# Patient Record
Sex: Female | Born: 1985 | Race: White | Hispanic: No | Marital: Married | Smoking: Never smoker
Health system: Southern US, Community
[De-identification: ages and names within clinical notes are randomized; demographics above are authoritative.]

## PROBLEM LIST (undated history)

## (undated) DIAGNOSIS — D489 Neoplasm of uncertain behavior, unspecified: Secondary | ICD-10-CM

## (undated) DIAGNOSIS — R519 Headache, unspecified: Secondary | ICD-10-CM

## (undated) DIAGNOSIS — R51 Headache: Secondary | ICD-10-CM

## (undated) DIAGNOSIS — K219 Gastro-esophageal reflux disease without esophagitis: Secondary | ICD-10-CM

## (undated) HISTORY — PX: OOPHORECTOMY: SHX86

## (undated) HISTORY — DX: Gastro-esophageal reflux disease without esophagitis: K21.9

## (undated) HISTORY — PX: NO PAST SURGERIES: SHX2092

---

## 2012-06-22 ENCOUNTER — Ambulatory Visit (INDEPENDENT_AMBULATORY_CARE_PROVIDER_SITE_OTHER): Payer: Self-pay | Admitting: Physician Assistant

## 2012-06-22 VITALS — BP 119/73 | HR 61 | Temp 98.6°F | Resp 16 | Ht 69.25 in | Wt 228.2 lb

## 2012-06-22 DIAGNOSIS — Z0289 Encounter for other administrative examinations: Secondary | ICD-10-CM

## 2012-06-22 DIAGNOSIS — Z111 Encounter for screening for respiratory tuberculosis: Secondary | ICD-10-CM

## 2012-06-22 NOTE — Progress Notes (Signed)
  Subjective:    Patient ID: Lindsay Turner, female    DOB: [birth year], 26 y.o.   MRN: [id number]  HPI 26 year old female presents for pre-employment PE and TB test. 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 She has no known medical problems and does not take any medications regularly.  She has had TB tests in the past and never had a positive.  Had a Tdap in 2006.     Review of Systems  All other systems reviewed and are negative.       Objective:   Physical Exam  Constitutional: She is oriented to person, place, and time. She appears well-developed and well-nourished.  HENT:  Head: Normocephalic and atraumatic.  Right Ear: Hearing, tympanic membrane, external ear and ear canal normal.  Left Ear: Hearing, tympanic membrane, external ear and ear canal normal.  Mouth/Throat: Uvula is midline, oropharynx is clear and moist and mucous membranes are normal. No oropharyngeal exudate.  Eyes: Conjunctivae are normal.  Neck: Normal range of motion.  Cardiovascular: Normal rate, regular rhythm and normal heart sounds.   Pulmonary/Chest: Effort normal and breath sounds normal.  Abdominal: Soft. Bowel sounds are normal. There is no tenderness.  Lymphadenopathy:    She has no cervical adenopathy.  Neurological: She is alert and oriented to person, place, and time.  Psychiatric: She has a normal mood and affect. Her behavior is normal. Judgment and thought content normal.          Assessment & Plan:   1. Other general medical examination for administrative purposes    2. Screening for tuberculosis  TB Skin Test   Forms completed. Return in 48-72 hours for TB read

## 2012-06-22 NOTE — Progress Notes (Signed)
  Subjective:    Patient ID: Lindsay Turner, female    DOB: May 28, 1986, 26 y.o.   MRN: [id number]  HPI    Review of Systems     Objective:   Physical Exam        Assessment & Plan:

## 2012-06-22 NOTE — Progress Notes (Signed)
PPD Placement note  Lindsay Turner, 26 y.o. female is here today for placement of PPD test  Reason for PPD test: pre employment  1. Has the patient ever had a TB Skin Test?: yes  2. Has the patient had a TB Skin Test in the past 6 weeks? no    3. Has the patient ever had a positive reaction? no  If yes, were you treated with INH? n/a   4. Has the patient ever had a BCG vaccine? no  5. Has the patient ever had Tuberculosis? no  6. Has the patient been in recent contact with anyone known or suspected of having     active TB disease?: no      Date of exposure (if applicable):       Name of person they were exposed to (if applicable):   Patient's Country of origin?:   7. Has the patient ever been advised by a healthcare provider not to have a TB skin test? no   Verified in allergy area and with patient that the patient is not allergic to the products PPD is made of (Phenol or Tween). Yes  Is patient taking any oral or IV steroid medication now or have they taken it in the last month? no  PPD placed on 06/22/2012 at 11:05 am.  Patient advised to return for reading within 48-72 hours.

## 2012-06-25 ENCOUNTER — Ambulatory Visit (INDEPENDENT_AMBULATORY_CARE_PROVIDER_SITE_OTHER): Payer: Self-pay | Admitting: *Deleted

## 2012-06-25 DIAGNOSIS — Z111 Encounter for screening for respiratory tuberculosis: Secondary | ICD-10-CM

## 2012-06-25 LAB — TB SKIN TEST
Induration: 0 mm
TB Skin Test: NEGATIVE

## 2013-09-21 ENCOUNTER — Ambulatory Visit (INDEPENDENT_AMBULATORY_CARE_PROVIDER_SITE_OTHER): Payer: No Typology Code available for payment source | Admitting: Family Medicine

## 2013-09-21 VITALS — BP 146/72 | HR 92 | Temp 98.4°F | Resp 18 | Ht 69.0 in | Wt 222.0 lb

## 2013-09-21 DIAGNOSIS — M79609 Pain in unspecified limb: Secondary | ICD-10-CM

## 2013-09-21 DIAGNOSIS — L600 Ingrowing nail: Secondary | ICD-10-CM

## 2013-09-21 DIAGNOSIS — M79674 Pain in right toe(s): Secondary | ICD-10-CM

## 2013-09-21 NOTE — Progress Notes (Signed)
Subjective:    Patient ID: Lindsay Turner, female    DOB: 04-13-1986, 27 y.o.   MRN: [id number] This chart was scribed for Meredith Staggers, MD by Valera Castle, ED Scribe. This patient was seen in room 9 and the patient's care was started at 7:15 PM.  Chief Complaint  Patient presents with  . Ingrown Toenail    1 week   HPI Lindsay Turner is a 27 y.o. female Pt reports today with an ingrown toenail on her right great toe, with associated pain and swelling, onset 1.5 weeks ago. She also reports the area has been draining for about 1 week. She states that yesterday a child stepped on her toe, exacerbating her pain. She reports pulling off the top part of her toenail after messing with it. She reports applying neosporin to the area. She reports soaking her toe in warm water. She denies any allergies to medications. She denies fever, and any other associated symptoms. She denies any medical history.  PCP - No primary provider on file.  There are no active problems to display for this patient.  No past medical history on file. No past surgical history on file. No Known Allergies Prior to Admission medications   Not on File    Review of Systems  Constitutional: Negative for fever.  Skin: Positive for wound (ingrown toenail, right geat toe, with drainage).       Objective:   Physical Exam  Nursing note and vitals reviewed. Constitutional: She is oriented to person, place, and time. She appears well-developed and well-nourished. No distress.  HENT:  Head: Normocephalic and atraumatic.  Eyes: EOM are normal.  Neck: Neck supple. No tracheal deviation present.  Cardiovascular: Normal rate.   Pulmonary/Chest: Effort normal. No respiratory distress.  Musculoskeletal: Normal range of motion.  Neurological: She is alert and oriented to person, place, and time.  Skin: Skin is warm and dry. She is not diaphoretic.  Right great toe medial nailfold edematous, slightly erythematous,  with small amount of clear to yellow discharge along lateral nailfold. Some granulation tissue seen at distal nail. Unable to visiualize distal nail laterally. Slight ttp over distal lateral nailfold.   Psychiatric: She has a normal mood and affect. Her behavior is normal.    BP 146/72  Pulse 92  Temp(Src) 98.4 F (36.9 C) (Oral)  Resp 18  Ht 5\' 9"  (1.753 m)  Wt 222 lb (100.699 kg)  BMI 32.77 kg/m2  SpO2 100%  LMP 09/12/2013     Assessment & Plan:    Lindsay Turner is a 27 y.o. female Pain of great toe, right  Ingrown right greater toenail  Wedge excision as above. Wound care and future nail care discussed. rtc precautions.   No orders of the defined types were placed in this encounter.   Patient Instructions  INGROWN TOENAIL . Keep area clean, dry and bandaged for 24 hours. . After 24 hours, remove outer bandage and leave yellow gauze in place. Nuala Alpha toe/foot in warm soapy water for 5-10 minutes, once daily for 5 days. Rebandage toe after each cleaning. . Continue soaks until yellow gauze falls off. . Notify the office if you experience any of the following signs of infection: Swelling, redness, pus drainage, streaking, fever > 101.0 F  Return to the clinic or go to the nearest emergency room if any of your symptoms worsen or new symptoms occur.     I personally performed the services described in this documentation, which was  scribed in my presence. The recorded information has been reviewed and considered, and addended by me as needed.

## 2013-09-21 NOTE — Patient Instructions (Signed)
INGROWN TOENAIL . Keep area clean, dry and bandaged for 24 hours. . After 24 hours, remove outer bandage and leave yellow gauze in place. Nuala Alpha toe/foot in warm soapy water for 5-10 minutes, once daily for 5 days. Rebandage toe after each cleaning. . Continue soaks until yellow gauze falls off. . Notify the office if you experience any of the following signs of infection: Swelling, redness, pus drainage, streaking, fever > 101.0 F  Return to the clinic or go to the nearest emergency room if any of your symptoms worsen or new symptoms occur.

## 2013-09-21 NOTE — Progress Notes (Signed)
Procedure:  Consent obtained.  2% lido mixed 50:50 with marcaine used for a digital block on R great toe.  Betadine prep.  Lateral wedge excision.  Granulation tissue excised and xeroform gauze placed on nail bed.  Drsg placed.  Wound care d/w pt.

## 2013-10-10 ENCOUNTER — Ambulatory Visit (INDEPENDENT_AMBULATORY_CARE_PROVIDER_SITE_OTHER): Payer: No Typology Code available for payment source | Admitting: Family Medicine

## 2013-10-10 VITALS — BP 102/70 | HR 108 | Temp 99.4°F | Resp 16 | Ht 69.5 in | Wt 220.0 lb

## 2013-10-10 DIAGNOSIS — J111 Influenza due to unidentified influenza virus with other respiratory manifestations: Secondary | ICD-10-CM

## 2013-10-10 DIAGNOSIS — R509 Fever, unspecified: Secondary | ICD-10-CM

## 2013-10-10 LAB — POCT INFLUENZA A/B
Influenza A, POC: POSITIVE
Influenza B, POC: NEGATIVE

## 2013-10-10 MED ORDER — OSELTAMIVIR PHOSPHATE 75 MG PO CAPS: 75.0000 mg | ORAL_CAPSULE | Freq: Two times a day (BID) | ORAL | Status: DC

## 2013-10-10 MED ORDER — HYDROCODONE-HOMATROPINE 5-1.5 MG/5ML PO SYRP: 5.0000 mL | ORAL_SOLUTION | Freq: Three times a day (TID) | ORAL | Status: DC | PRN

## 2013-10-10 NOTE — Patient Instructions (Signed)

## 2013-10-10 NOTE — Progress Notes (Signed)
° °  Subjective:    Patient ID: Lindsay Turner, female    DOB: May 27, 1986, 27 y.o.   MRN: [id number] This chart was scribed for Elvina Sidle, MD by Valera Castle, ED Scribe. This patient was seen in room 9 and the patient's care was started at 6:30 PM.   Chief Complaint  Patient presents with   Abdominal Pain   Headache   Sore Throat   Fever   Cough   Otitis Media    HPI Lindsay Turner is a 27 y.o. female who presents to the [facility] complaining of sudden illness, onset 2 days ago, including fatigue, cough, fever, nasal congestion, sinus pressure, bilateral ear pain, diarrhea, stomach cramps, and mild back pain. She denies anything relieving her symptoms. She denies vomiting, urinary symptoms, and any other associated symptoms.   She states she works at [employer] with lots of children.   Review of Systems  Constitutional: Positive for fever and fatigue.  HENT: Positive for congestion, ear pain and sinus pressure.   Respiratory: Positive for cough.   Gastrointestinal: Positive for abdominal pain and diarrhea. Negative for vomiting.  Genitourinary: Negative.   Musculoskeletal: Positive for back pain.       Objective:   Physical Exam  Results for orders placed in visit on 10/10/13  POCT INFLUENZA A/B      Result Value Range   Influenza A, POC Positive     Influenza B, POC Negative     BP 102/70   Pulse 108   Temp(Src) 99.4 F (37.4 C) (Oral)   Resp 16   Ht 5' 9.5" (1.765 m)   Wt 220 lb (99.791 kg)   BMI 32.03 kg/m2   SpO2 98%   LMP 09/12/2013  Nursing note and vitals reviewed. Constitutional: Pt is oriented to person, place, and time. Pt appears well-developed and well-nourished. No distress.  HENT:  Head: Normocephalic and atraumatic.  Eyes: EOM are normal. Pupils are equal, round, and reactive to light.  Neck: Neck supple. No tracheal deviation present.  Cardiovascular: Normal rate, regular rhythm and normal heart sounds.  Exam reveals no gallop and no friction  rub. No murmur heard. Pulmonary/Chest: Effort normal and breath sounds normal. No respiratory distress. Pt has no wheezes. Pt has no rales.  Abdominal: Soft. Bowel sounds are normal. There is no tenderness. There is no rebound and no guarding.  Musculoskeletal: Normal range of motion.  Neurological: Pt is alert and oriented to person, place, and time.  Skin: Skin is warm and dry.  Psychiatric: Pt has a normal mood and affect. Pt's behavior is normal.  Nasal passages show mucopurulent discharge TMs: Mild bulge, no erythema Oropharynx: Mild erythema Chest: Clear Results for orders placed in visit on 10/10/13  POCT INFLUENZA A/B      Result Value Range   Influenza A, POC Positive     Influenza B, POC Negative         Assessment & Plan:    Fever, unspecified - Plan: POCT Influenza A/B, oseltamivir (TAMIFLU) 75 MG capsule, HYDROcodone-homatropine (HYCODAN) 5-1.5 MG/5ML syrup  Influenza - Plan: HYDROcodone-homatropine (HYCODAN) 5-1.5 MG/5ML syrup  Signed, Elvina Sidle, MD     I personally performed the services described in this documentation, which was scribed in my presence. The recorded information has been reviewed and is accurate.

## 2014-03-23 ENCOUNTER — Ambulatory Visit (INDEPENDENT_AMBULATORY_CARE_PROVIDER_SITE_OTHER): Payer: No Typology Code available for payment source | Admitting: Family Medicine

## 2014-03-23 VITALS — BP 126/74 | HR 110 | Temp 100.0°F | Resp 18 | Ht 69.0 in | Wt 221.4 lb

## 2014-03-23 DIAGNOSIS — J029 Acute pharyngitis, unspecified: Secondary | ICD-10-CM

## 2014-03-23 DIAGNOSIS — R509 Fever, unspecified: Secondary | ICD-10-CM

## 2014-03-23 DIAGNOSIS — R6883 Chills (without fever): Secondary | ICD-10-CM

## 2014-03-23 LAB — POCT CBC
GRANULOCYTE PERCENT: 83.3 % — AB (ref 37–80)
HCT, POC: 42.8 % (ref 37.7–47.9)
HEMOGLOBIN: 13.7 g/dL (ref 12.2–16.2)
Lymph, poc: 0.7 (ref 0.6–3.4)
MCH, POC: 28.5 pg (ref 27–31.2)
MCHC: 32 g/dL (ref 31.8–35.4)
MCV: 89.1 fL (ref 80–97)
MID (CBC): 0.5 (ref 0–0.9)
MPV: 10.6 fL (ref 0–99.8)
PLATELET COUNT, POC: 212 10*3/uL (ref 142–424)
POC Granulocyte: 6.2 (ref 2–6.9)
POC LYMPH PERCENT: 10.1 %L (ref 10–50)
POC MID %: 6.6 % (ref 0–12)
RBC: 4.8 M/uL (ref 4.04–5.48)
RDW, POC: 13.8 %
WBC: 7.4 10*3/uL (ref 4.6–10.2)

## 2014-03-23 LAB — POCT RAPID STREP A (OFFICE): RAPID STREP A SCREEN: NEGATIVE

## 2014-03-23 MED ORDER — AMOXICILLIN 500 MG PO CAPS: 500.0000 mg | ORAL_CAPSULE | Freq: Three times a day (TID) | ORAL | Status: DC

## 2014-03-23 NOTE — Progress Notes (Addendum)
Subjective:    Patient ID: Lindsay Turner, female    DOB: 1986/03/16, 28 y.o.   MRN: [id number]  No PCP Per Patient  HPI This chart was scribed for Lindsay Agreste, MD by Ladene Artist, ED Scribe. The patient was seen in room 4. Patient's care was started at 2:37 PM.  HPI Comments: Lindsay Turner is a 28 y.o. female who presents to the [facility] complaining of sore throat onset yesterday evening. Pt reports associated subjective fever, chills and body aches onset last night. She denies cough, SOB, rash, HA, ear pain, urinary symptoms. No sick contacts. No recent hiking, camping, exposure to ticks, yard work. Pt does have pets.  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  There are no active problems to display for this patient.  Past Medical History  Diagnosis Date  . GERD (gastroesophageal reflux disease)    No past surgical history on file. No Known Allergies Prior to Admission medications   Not on File   History   Social History  . Marital Status: Single    Spouse Name: N/A    Number of Children: N/A  . Years of Education: N/A   Occupational History  . Not on file.   Social History Main Topics  . Smoking status: Never Smoker   . Smokeless tobacco: Not on file  . Alcohol Use: Yes  . Drug Use: No  . Sexual Activity: Not on file   Other Topics Concern  . Not on file   Social History Narrative  . No narrative on file   Review of Systems  Constitutional: Positive for fever and chills.  HENT: Positive for sore throat. Negative for ear pain.   Respiratory: Negative for cough and shortness of breath.   Genitourinary: Negative for dysuria, hematuria, decreased urine volume and difficulty urinating.  Musculoskeletal: Positive for myalgias.  Skin: Negative for rash.  Neurological: Negative for headaches.      Objective:   Physical Exam  Vitals reviewed. Constitutional: She is oriented to person, place, and time. She  appears well-developed and well-nourished. No distress.  HENT:  Head: Normocephalic and atraumatic.  Right Ear: Hearing, tympanic membrane, external ear and ear canal normal.  Left Ear: Hearing, tympanic membrane, external ear and ear canal normal.  Nose: Nose normal.  Mouth/Throat: Posterior oropharyngeal erythema present. No oropharyngeal exudate.  Mouth/Throat:  1 erythematous spot on top L pharynx Minimal erythema of tonsillar recess No exudate   Eyes: Conjunctivae and EOM are normal. Pupils are equal, round, and reactive to light.  Cardiovascular: Normal rate, regular rhythm, normal heart sounds and intact distal pulses.   No murmur heard. Pulmonary/Chest: Effort normal and breath sounds normal. No respiratory distress. She has no wheezes. She has no rhonchi.  Neurological: She is alert and oriented to person, place, and time.  Skin: Skin is warm and dry. No rash noted.  Psychiatric: She has a normal mood and affect. Her behavior is normal.   Filed Vitals:   03/23/14 1426  BP: 126/74  Pulse: 110  Temp: 100 F (37.8 C)  TempSrc: Oral  Resp: 18  Height: 5\' 9"  (1.753 m)  Weight: 221 lb 6.4 oz (100.426 kg)  SpO2: 100%   Results for orders placed in visit on 03/23/14  POCT RAPID STREP A (OFFICE)      Result Value Ref Range   Rapid Strep A Screen Negative  Negative  POCT CBC      Result Value Ref  Range   WBC 7.4  4.6 - 10.2 K/uL   Lymph, poc 0.7  0.6 - 3.4   POC LYMPH PERCENT 10.1  10 - 50 %L   MID (cbc) 0.5  0 - 0.9   POC MID % 6.6  0 - 12 %M   POC Granulocyte 6.2  2 - 6.9   Granulocyte percent 83.3 (*) 37 - 80 %G   RBC 4.80  4.04 - 5.48 M/uL   Hemoglobin 13.7  12.2 - 16.2 g/dL   HCT, POC 42.8  37.7 - 47.9 %   MCV 89.1  80 - 97 fL   MCH, POC 28.5  27 - 31.2 pg   MCHC 32.0  31.8 - 35.4 g/dL   RDW, POC 13.8     Platelet Count, POC 212  142 - 424 K/uL   MPV 10.6  0 - 99.8 fL       Assessment & Plan:   [name] is a 28 y.o. female Sore throat - Plan:  POCT rapid strep A, Culture, Group A Strep, amoxicillin (AMOXIL) 500 MG capsule  Fever, unspecified - Plan: POCT rapid strep A, Culture, Group A Strep, POCT CBC, amoxicillin (AMOXIL) 500 MG capsule  Chills - Plan: POCT CBC  Viral pharyngitis vs false negative strep.  Borderline leukocytes, options discussed re: cx and waiting, or start amox, then d/c if cx negative. Will start amoxicillin as below, sx care, rtc precautions.   Meds ordered this encounter  Medications  . amoxicillin (AMOXIL) 500 MG capsule    Sig: Take 1 capsule (500 mg total) by mouth 3 (three) times daily.    Dispense:  30 capsule    Refill:  0   Patient Instructions  Tylenol or motrin if needed for fever, drink plenty of fluids. We can start antibiotic as borderline infection fighting cells, but if throat culture negative, can stop them at that point.   Return to the clinic or go to the nearest emergency room if any of your symptoms worsen or new symptoms occur. Sore Throat A sore throat is pain, burning, irritation, or scratchiness of the throat. There is often pain or tenderness when swallowing or talking. A sore throat may be accompanied by other symptoms, such as coughing, sneezing, fever, and swollen neck glands. A sore throat is often the first sign of another sickness, such as a cold, flu, strep throat, or mononucleosis (commonly known as mono). Most sore throats go away without medical treatment. CAUSES  The most common causes of a sore throat include:  A viral infection, such as a cold, flu, or mono.  A bacterial infection, such as strep throat, tonsillitis, or whooping cough.  Seasonal allergies.  Dryness in the air.  Irritants, such as smoke or pollution.  Gastroesophageal reflux disease (GERD). HOME CARE INSTRUCTIONS   Only take over-the-counter medicines as directed by your caregiver.  Drink enough fluids to keep your urine clear or pale yellow.  Rest as needed.  Try using throat sprays,  lozenges, or sucking on hard candy to ease any pain (if older than 4 years or as directed).  Sip warm liquids, such as broth, herbal tea, or warm water with honey to relieve pain temporarily. You may also eat or drink cold or frozen liquids such as frozen ice pops.  Gargle with salt water (mix 1 tsp salt with 8 oz of water).  Do not smoke and avoid secondhand smoke.  Put a cool-mist humidifier in your bedroom at night to moisten the  air. You can also turn on a hot shower and sit in the bathroom with the door closed for 5 10 minutes. SEEK IMMEDIATE MEDICAL CARE IF:  You have difficulty breathing.  You are unable to swallow fluids, soft foods, or your saliva.  You have increased swelling in the throat.  Your sore throat does not get better in 7 days.  You have nausea and vomiting.  You have a fever or persistent symptoms for more than 2 3 days.  You have a fever and your symptoms suddenly get worse. MAKE SURE YOU:   Understand these instructions.  Will watch your condition.  Will get help right away if you are not doing well or get worse. Document Released: 11/12/2004 Document Revised: 09/21/2012 Document Reviewed: 06/12/2012 [facility] Patient Information 2014 [place], [place].   I personally performed the services described in this documentation, which was scribed in my presence. The recorded information has been reviewed and is accurate.

## 2014-03-23 NOTE — Patient Instructions (Addendum)
Tylenol or motrin if needed for fever, drink plenty of fluids. We can start antibiotic as borderline infection fighting cells, but if throat culture negative, can stop them at that point.   Return to the clinic or go to the nearest emergency room if any of your symptoms worsen or new symptoms occur. Sore Throat A sore throat is pain, burning, irritation, or scratchiness of the throat. There is often pain or tenderness when swallowing or talking. A sore throat may be accompanied by other symptoms, such as coughing, sneezing, fever, and swollen neck glands. A sore throat is often the first sign of another sickness, such as a cold, flu, strep throat, or mononucleosis (commonly known as mono). Most sore throats go away without medical treatment. CAUSES  The most common causes of a sore throat include:  A viral infection, such as a cold, flu, or mono.  A bacterial infection, such as strep throat, tonsillitis, or whooping cough.  Seasonal allergies.  Dryness in the air.  Irritants, such as smoke or pollution.  Gastroesophageal reflux disease (GERD). HOME CARE INSTRUCTIONS   Only take over-the-counter medicines as directed by your caregiver.  Drink enough fluids to keep your urine clear or pale yellow.  Rest as needed.  Try using throat sprays, lozenges, or sucking on hard candy to ease any pain (if older than 4 years or as directed).  Sip warm liquids, such as broth, herbal tea, or warm water with honey to relieve pain temporarily. You may also eat or drink cold or frozen liquids such as frozen ice pops.  Gargle with salt water (mix 1 tsp salt with 8 oz of water).  Do not smoke and avoid secondhand smoke.  Put a cool-mist humidifier in your bedroom at night to moisten the air. You can also turn on a hot shower and sit in the bathroom with the door closed for 5 10 minutes. SEEK IMMEDIATE MEDICAL CARE IF:  You have difficulty breathing.  You are unable to swallow fluids, soft foods,  or your saliva.  You have increased swelling in the throat.  Your sore throat does not get better in 7 days.  You have nausea and vomiting.  You have a fever or persistent symptoms for more than 2 3 days.  You have a fever and your symptoms suddenly get worse. MAKE SURE YOU:   Understand these instructions.  Will watch your condition.  Will get help right away if you are not doing well or get worse. Document Released: 11/12/2004 Document Revised: 09/21/2012 Document Reviewed: 06/12/2012 [facility] Patient Information 2014 [place], [place].

## 2014-03-25 LAB — CULTURE, GROUP A STREP: Organism ID, Bacteria: NORMAL

## 2014-08-17 ENCOUNTER — Ambulatory Visit (INDEPENDENT_AMBULATORY_CARE_PROVIDER_SITE_OTHER): Payer: No Typology Code available for payment source | Admitting: Family Medicine

## 2014-08-17 VITALS — BP 128/78 | HR 71 | Temp 98.0°F | Resp 16 | Ht 70.0 in | Wt 222.0 lb

## 2014-08-17 DIAGNOSIS — L600 Ingrowing nail: Secondary | ICD-10-CM

## 2014-08-17 MED ORDER — DOXYCYCLINE HYCLATE 100 MG PO TABS: 100.0000 mg | ORAL_TABLET | Freq: Two times a day (BID) | ORAL | Status: DC

## 2014-08-17 NOTE — Progress Notes (Signed)
° °  Subjective:    Patient ID: Lindsay Turner, female    DOB: 1986/07/06, 28 y.o.   MRN: [id number] This chart was scribed for Lindsay Haber, MD by Zola Button, Medical Scribe. This patient was seen in Room 4 and the patient's care was started at 9:27 AM.   HPI HPI Comments: Lindsay Turner is a 28 y.o. female who presents to the [facility] complaining of an ingrown toenail in her right 1st toe that has been there for a while. She had an ingrown toenail in the same toe about months ago; she had it removed last time. Patient is training for a half-marathon and denies any pain when she is running. She has done a half-marathon before. She has delayed coming in because it had not been bothering her very much.  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  Review of Systems  Constitutional: Negative for appetite change and fatigue.  HENT: Negative for congestion, ear discharge and sinus pressure.   Eyes: Negative for discharge.  Respiratory: Negative for cough.   Cardiovascular: Negative for chest pain.  Gastrointestinal: Negative for abdominal pain and diarrhea.  Genitourinary: Negative for frequency and hematuria.  Musculoskeletal: Negative for back pain.  Skin: Positive for wound (ingrown toenail).  Neurological: Negative for seizures and headaches.  Psychiatric/Behavioral: Negative for hallucinations.       Objective:   Physical Exam CONSTITUTIONAL: Well developed/well nourished HEAD: Normocephalic/atraumatic EYES: EOM/PERRL ENMT: Mucous membranes moist NECK: supple no meningeal signs SPINE: entire spine nontender CV: S1/S2 noted, no murmurs/rubs/gallops noted LUNGS: Lungs are clear to auscultation bilaterally, no apparent distress ABDOMEN: soft, nontender, no rebound or guarding GU: no cva tenderness NEURO: Pt is awake/alert, moves all extremitiesx4 EXTREMITIES: pulses normal, full ROM. Mildly tender, swollen and erythenatous fibular aspect of right big  toenail. SKIN: warm, color normal PSYCH: no abnormalities of mood noted        Assessment & Plan:   Ingrown right big toenail - Plan: doxycycline (VIBRA-TABS) 100 MG tablet  Signed, Lindsay Haber, MD

## 2016-07-10 ENCOUNTER — Ambulatory Visit (INDEPENDENT_AMBULATORY_CARE_PROVIDER_SITE_OTHER): Payer: 59 | Admitting: Physician Assistant

## 2016-07-10 VITALS — BP 118/82 | HR 82 | Temp 98.4°F | Resp 18 | Ht 70.0 in | Wt 254.4 lb

## 2016-07-10 DIAGNOSIS — R350 Frequency of micturition: Secondary | ICD-10-CM

## 2016-07-10 LAB — POCT URINALYSIS DIP (MANUAL ENTRY)
BILIRUBIN UA: NEGATIVE
GLUCOSE UA: NEGATIVE
Ketones, POC UA: NEGATIVE
NITRITE UA: NEGATIVE
PH UA: 5
Protein Ur, POC: NEGATIVE
RBC UA: NEGATIVE
Spec Grav, UA: 1.025
UROBILINOGEN UA: 0.2

## 2016-07-10 LAB — POC MICROSCOPIC URINALYSIS (UMFC): Mucus: ABSENT

## 2016-07-10 LAB — POCT URINE PREGNANCY: PREG TEST UR: NEGATIVE

## 2016-07-10 MED ORDER — NITROFURANTOIN MONOHYD MACRO 100 MG PO CAPS: 100.0000 mg | ORAL_CAPSULE | Freq: Two times a day (BID) | ORAL | 0 refills | Status: AC

## 2016-07-10 NOTE — Patient Instructions (Signed)
I will get a urine culture to make sure this is a urinary tract infection. We can start macrobid today, and I can contact you with the results.  I would also like you to take miralax at this time.  You can put 17g of it in 8 oz of water twice per day for no more than 1 week.  You are able to take it daily as well.

## 2016-07-10 NOTE — Progress Notes (Signed)
[facility] [address] [place] [address] [phone]  Date:  07/10/2016   Name:  Lindsay Turner   DOB:  1985/12/27   MRN:  [id number]  PCP:  No PCP Per Patient   By signing my name below, I, Judithe Modest, attest that this documentation has been prepared under the direction and in the presence of Ivar Drape, [employer]. Electronically Signed: Judithe Modest, ER Scribe. 05/30/2016. 4:22 PM.  History of Present Illness:  Lindsay Turner is a 30 y.o. female patient who presents to [facility] complaining of two weeks of intermittent urinary frequency and abdominal soreness. She is also complaining of chronic constipation with a BM every other day. Her constipation has been worse the last few weeks. She denies, nausea, back pain, vaginal discharge, hematuria or dysuria. She is sexually active, uses condoms. She has one sexual partner. She used a OTC UTI test that was half positive earlier today. Her LNMP was on 06/16/16.   There are no active problems to display for this patient.   Past Medical History:  Diagnosis Date   GERD (gastroesophageal reflux disease)     No past surgical history on file.  Social History  Substance Use Topics   Smoking status: Never Smoker   Smokeless tobacco: Not on file   Alcohol use Yes    Family History  Problem Relation Age of Onset   Hypertension Mother    Cancer Father    Diabetes Father    Heart disease Father    Hypertension Father    Cancer Brother    Heart disease Maternal Grandmother    Hypertension Maternal Grandmother    Cancer Maternal Grandfather    Diabetes Maternal Grandfather    Heart disease Maternal Grandfather    Cancer Paternal Grandfather    Diabetes Paternal Grandfather     No Known Allergies  Medication list has been reviewed and updated.  No current outpatient prescriptions on file prior to visit.   No current facility-administered medications on file prior to visit.      Review of Systems  Constitutional: Negative for chills, diaphoresis and fever.  Gastrointestinal: Positive for constipation. Negative for blood in stool and diarrhea.  Genitourinary: Positive for frequency and urgency. Negative for dysuria and hematuria.  Skin: Negative for rash.  Neurological: Negative for dizziness and weakness.     Physical Examination: BP 118/82    Pulse 82    Temp 98.4 F (36.9 C) (Oral)    Resp 18    Ht 5\' 10"  (1.778 m)    Wt 254 lb 6.4 oz (115.4 kg)    LMP 06/17/2016    SpO2 99%    BMI 36.50 kg/m  Ideal Body Weight: @FLOWAMB [id number]  Physical Exam  Constitutional: She is oriented to person, place, and time. She appears well-developed and well-nourished. No distress.  HENT:  Head: Normocephalic and atraumatic.  Mouth/Throat: Oropharynx is clear and moist.  Eyes: Pupils are equal, round, and reactive to light.  Neck: Neck supple.  Cardiovascular: Normal rate.   Pulmonary/Chest: Effort normal and breath sounds normal. No respiratory distress.  Abdominal: Soft. There is tenderness.  No flank tenderness. Generalized abdominal TTP. Bowel sounds normal no guarding.  Musculoskeletal: Normal range of motion.  Neurological: She is alert and oriented to person, place, and time. Coordination normal.  Skin: Skin is warm and dry. She is not diaphoretic.  Psychiatric: She has a normal mood and affect. Her behavior is normal.  Nursing note and vitals  reviewed.  Results for orders placed or performed in visit on 07/10/16  POCT urinalysis dipstick  Result Value Ref Range   Color, UA yellow yellow   Clarity, UA clear clear   Glucose, UA negative negative   Bilirubin, UA negative negative   Ketones, POC UA negative negative   Spec Grav, UA 1.025    Blood, UA negative negative   pH, UA 5.0    Protein Ur, POC negative negative   Urobilinogen, UA 0.2    Nitrite, UA Negative Negative   Leukocytes, UA Trace (A) Negative  POCT Microscopic Urinalysis ([facility])   Result Value Ref Range   WBC,UR,HPF,POC Few (A) None WBC/hpf   RBC,UR,HPF,POC None None RBC/hpf   Bacteria None None, Too numerous to count   Mucus Absent Absent   Epithelial Cells, UR Per Microscopy Few (A) None, Too numerous to count cells/hpf  POCT urine pregnancy  Result Value Ref Range   Preg Test, Ur Negative Negative     Assessment and Plan: Lindsay Turner is a 30 y.o. female who is here today for cc of urinary frequency.   We will proceed with therapy.  Urine culture obtained at this time. Will follow up with results.   Increased urinary frequency - Plan: POCT urinalysis dipstick, POCT Microscopic Urinalysis ([facility]), POCT urine pregnancy, nitrofurantoin, macrocrystal-monohydrate, (MACROBID) 100 MG capsule, Urine culture  Ivar Drape, PA-C [facility] 07/10/2016 2:34 PM  I personally performed the services described in this documentation, which was scribed in my presence. The recorded information has been reviewed and is accurate.

## 2016-07-12 LAB — URINE CULTURE: ORGANISM ID, BACTERIA: NO GROWTH

## 2016-07-25 ENCOUNTER — Telehealth: Payer: Self-pay | Admitting: *Deleted

## 2016-07-25 NOTE — Telephone Encounter (Signed)
Patient notified of results of urine from 07/10/16

## 2016-07-27 ENCOUNTER — Emergency Department (HOSPITAL_COMMUNITY): Payer: 59

## 2016-07-27 ENCOUNTER — Ambulatory Visit (INDEPENDENT_AMBULATORY_CARE_PROVIDER_SITE_OTHER): Payer: 59

## 2016-07-27 ENCOUNTER — Emergency Department (HOSPITAL_COMMUNITY)
Admission: EM | Admit: 2016-07-27 | Discharge: 2016-07-28 | Disposition: A | Discharge status: Home / Self Care | Payer: 59 | Attending: Emergency Medicine | Admitting: Emergency Medicine

## 2016-07-27 ENCOUNTER — Ambulatory Visit (INDEPENDENT_AMBULATORY_CARE_PROVIDER_SITE_OTHER): Payer: 59 | Admitting: Urgent Care

## 2016-07-27 ENCOUNTER — Ambulatory Visit (HOSPITAL_BASED_OUTPATIENT_CLINIC_OR_DEPARTMENT_OTHER)
Admission: RE | Admit: 2016-07-27 | Discharge: 2016-07-27 | Disposition: A | Discharge status: Home / Self Care | Payer: 59 | Source: Ambulatory Visit | Attending: Urgent Care | Admitting: Urgent Care

## 2016-07-27 VITALS — BP 122/72 | HR 96 | Temp 98.4°F | Resp 16 | Ht 70.0 in | Wt 252.0 lb

## 2016-07-27 DIAGNOSIS — Z79899 Other long term (current) drug therapy: Secondary | ICD-10-CM | POA: Insufficient documentation

## 2016-07-27 DIAGNOSIS — R1909 Other intra-abdominal and pelvic swelling, mass and lump: Secondary | ICD-10-CM

## 2016-07-27 DIAGNOSIS — R102 Pelvic and perineal pain: Secondary | ICD-10-CM | POA: Diagnosis present

## 2016-07-27 DIAGNOSIS — R109 Unspecified abdominal pain: Secondary | ICD-10-CM

## 2016-07-27 DIAGNOSIS — R195 Other fecal abnormalities: Secondary | ICD-10-CM

## 2016-07-27 DIAGNOSIS — R1900 Intra-abdominal and pelvic swelling, mass and lump, unspecified site: Secondary | ICD-10-CM | POA: Diagnosis not present

## 2016-07-27 LAB — POCT URINALYSIS DIP (MANUAL ENTRY)
BILIRUBIN UA: NEGATIVE
BILIRUBIN UA: NEGATIVE
Blood, UA: NEGATIVE
GLUCOSE UA: NEGATIVE
Nitrite, UA: NEGATIVE
PROTEIN UA: NEGATIVE
SPEC GRAV UA: 1.02
Urobilinogen, UA: 0.2
pH, UA: 5.5

## 2016-07-27 LAB — COMPREHENSIVE METABOLIC PANEL
ALK PHOS: 81 U/L (ref 38–126)
ALT: 27 U/L (ref 14–54)
AST: 25 U/L (ref 15–41)
Albumin: 4.5 g/dL (ref 3.5–5.0)
Anion gap: 11 (ref 5–15)
BILIRUBIN TOTAL: 0.4 mg/dL (ref 0.3–1.2)
BUN: 8 mg/dL (ref 6–20)
CALCIUM: 9.6 mg/dL (ref 8.9–10.3)
CO2: 23 mmol/L (ref 22–32)
CREATININE: 0.67 mg/dL (ref 0.44–1.00)
Chloride: 102 mmol/L (ref 101–111)
Glucose, Bld: 108 mg/dL — ABNORMAL HIGH (ref 65–99)
Potassium: 3.4 mmol/L — ABNORMAL LOW (ref 3.5–5.1)
Sodium: 136 mmol/L (ref 135–145)
TOTAL PROTEIN: 8 g/dL (ref 6.5–8.1)

## 2016-07-27 LAB — POCT CBC
GRANULOCYTE PERCENT: 70.7 % (ref 37–80)
HCT, POC: 38.7 % (ref 37.7–47.9)
HEMOGLOBIN: 13.5 g/dL (ref 12.2–16.2)
Lymph, poc: 2.7 (ref 0.6–3.4)
MCH: 28.8 pg (ref 27–31.2)
MCHC: 34.8 g/dL (ref 31.8–35.4)
MCV: 82.6 fL (ref 80–97)
MID (CBC): 0.4 (ref 0–0.9)
MPV: 8.4 fL (ref 0–99.8)
PLATELET COUNT, POC: 267 10*3/uL (ref 142–424)
POC Granulocyte: 7.5 — AB (ref 2–6.9)
POC LYMPH PERCENT: 25.9 %L (ref 10–50)
POC MID %: 3.4 %M (ref 0–12)
RBC: 4.68 M/uL (ref 4.04–5.48)
RDW, POC: 13.4 %
WBC: 10.6 10*3/uL — AB (ref 4.6–10.2)

## 2016-07-27 LAB — POCT URINE PREGNANCY: Preg Test, Ur: NEGATIVE

## 2016-07-27 MED ORDER — IOPAMIDOL (ISOVUE-300) INJECTION 61%
100.0000 mL | Freq: Once | INTRAVENOUS | Status: AC | PRN
  Administered 2016-07-27: 100 mL via INTRAVENOUS

## 2016-07-27 NOTE — Progress Notes (Addendum)
Patient ID: Lindsay Turner, female   DOB: 1986/01/09, 30 y.o.   MRN: [id number]   By signing my name below, I, Lindsay Turner, attest that this documentation has been prepared under the direction and in the presence of Jaynee Eagles, PA-C Electronically Signed: Ladene Artist, ED Scribe 07/27/2016 at 4:52 PM.  MRN: [id number] DOB: July 11, 1986  Subjective:   Lindsay Turner is a 30 y.o. female presenting for chief complaint of Abdominal Pain (x 1 month off and on) and Diarrhea  Pt reports intermittent abdominal pain for the past month. She states that symptoms started as abdominal distension, diarrhea and constipation during her menstrual period in August but self-resolved. However, symptoms returned 3 days ago. She reports constant dull, aching left abdominal pain for the past 3 days which seems to worsen after eating leafy greens. Pt also reports at least 3-4 episodes of loose stools daily. She has tried fiber supplements which made symptoms worse. Pt denies blood in stools, fever, nausea, vomiting, hematuria. Pt also states that she was seen in the office 2 weeks ago for urinary frequency which resolved. No h/o abdominal surgeries.  No current outpatient prescriptions on file.   Also has No Known Allergies.  Tanyra  has a past medical history of GERD (gastroesophageal reflux disease). Also  has no past surgical history on file.  Objective:   Vitals: BP 122/72 (BP Location: Right Arm, Patient Position: Sitting, Cuff Size: Large)    Pulse 96    Temp 98.4 F (36.9 C)    Resp 16    Ht 5\' 10"  (1.778 m)    Wt 252 lb (114.3 kg)    LMP 07/13/2016 (Approximate)    SpO2 99%    BMI 36.16 kg/m   Physical Exam  Constitutional: She is oriented to person, place, and time. She appears well-developed and well-nourished.  Cardiovascular: Normal rate, regular rhythm and intact distal pulses.  Exam reveals no gallop and no friction rub.   No murmur heard. Pulmonary/Chest: No respiratory distress. She has no  wheezes. She has no rales.  Abdominal: She exhibits distension (L-sided). There is tenderness (LUQ greater than LLQ).  Neurological: She is alert and oriented to person, place, and time.   Dg Abd 1 View  Result Date: 07/27/2016 CLINICAL DATA:  Left side abdominal distention. Loose stools. Symptoms today. EXAM: ABDOMEN - 1 VIEW COMPARISON:  None. FINDINGS: The bowel gas pattern is normal. No radio-opaque calculi or other significant radiographic abnormality are seen. IMPRESSION: Negative exam. Electronically Signed   By: Inge Rise M.D.   On: 07/27/2016 17:39    Results for orders placed or performed in visit on 07/27/16 (from the past 24 hour(s))  POCT urine pregnancy     Status: None   Collection Time: 07/27/16  5:30 PM  Result Value Ref Range   Preg Test, Ur Negative Negative  POCT urinalysis dipstick     Status: Abnormal   Collection Time: 07/27/16  5:31 PM  Result Value Ref Range   Color, UA yellow yellow   Clarity, UA clear clear   Glucose, UA negative negative   Bilirubin, UA negative negative   Ketones, POC UA negative negative   Spec Grav, UA 1.020    Blood, UA negative negative   pH, UA 5.5    Protein Ur, POC negative negative   Urobilinogen, UA 0.2    Nitrite, UA Negative Negative   Leukocytes, UA small (1+) (A) Negative  POCT CBC     Status: Abnormal   Collection Time:  07/27/16  5:31 PM  Result Value Ref Range   WBC 10.6 (A) 4.6 - 10.2 K/uL   Lymph, poc 2.7 0.6 - 3.4   POC LYMPH PERCENT 25.9 10 - 50 %L   MID (cbc) 0.4 0 - 0.9   POC MID % 3.4 0 - 12 %M   POC Granulocyte 7.5 (A) 2 - 6.9   Granulocyte percent 70.7 37 - 80 %G   RBC 4.68 4.04 - 5.48 M/uL   Hemoglobin 13.5 12.2 - 16.2 g/dL   HCT, POC 38.7 37.7 - 47.9 %   MCV 82.6 80 - 97 fL   MCH, POC 28.8 27 - 31.2 pg   MCHC 34.8 31.8 - 35.4 g/dL   RDW, POC 13.4 %   Platelet Count, POC 267 142 - 424 K/uL   MPV 8.4 0 - 99.8 fL    Assessment and Plan :   1. Left sided abdominal pain 2. Loose stools -  Will pursue stat CT abdomen and pelvis. Patient is aware, will start with contrast here and report for belly CT now.   Jaynee Eagles, PA-C [facility] [phone] 07/27/2016 4:52 PM

## 2016-07-27 NOTE — Patient Instructions (Addendum)
Abdominal Pain, Adult Many things can cause abdominal pain. Usually, abdominal pain is not caused by a disease and will improve without treatment. It can often be observed and treated at home. Your health care provider will do a physical exam and possibly order blood tests and X-rays to help determine the seriousness of your pain. However, in many cases, more time must pass before a clear cause of the pain can be found. Before that point, your health care provider may not know if you need more testing or further treatment. HOME CARE INSTRUCTIONS Monitor your abdominal pain for any changes. The following actions may help to alleviate any discomfort you are experiencing:  Only take over-the-counter or prescription medicines as directed by your health care provider.  Do not take laxatives unless directed to do so by your health care provider.  Try a clear liquid diet (broth, tea, or water) as directed by your health care provider. Slowly move to a bland diet as tolerated. SEEK MEDICAL CARE IF:  You have unexplained abdominal pain.  You have abdominal pain associated with nausea or diarrhea.  You have pain when you urinate or have a bowel movement.  You experience abdominal pain that wakes you in the night.  You have abdominal pain that is worsened or improved by eating food.  You have abdominal pain that is worsened with eating fatty foods.  You have a fever. SEEK IMMEDIATE MEDICAL CARE IF:  Your pain does not go away within 2 hours.  You keep throwing up (vomiting).  Your pain is felt only in portions of the abdomen, such as the right side or the left lower portion of the abdomen.  You pass bloody or black tarry stools. MAKE SURE YOU:  Understand these instructions.  Will watch your condition.  Will get help right away if you are not doing well or get worse.   This information is not intended to replace advice given to you by your health care provider. Make sure you discuss  any questions you have with your health care provider.   Document Released: 07/15/2005 Document Revised: 06/26/2015 Document Reviewed: 06/14/2013 Elsevier Interactive Patient Education 2016 [employer].     IF you received an x-ray today, you will receive an invoice from [facility]. Please contact [facility] at [phone] with questions or concerns regarding your invoice.   IF you received labwork today, you will receive an invoice from [employer]. Please contact [facility] at [phone] with questions or concerns regarding your invoice.   Our billing staff will not be able to assist you with questions regarding bills from these companies.  You will be contacted with the lab results as soon as they are available. The fastest way to get your results is to activate your My Chart account. Instructions are located on the last page of this paperwork. If you have not heard from [place] regarding the results in 2 weeks, please contact this office.   YOU ARE SCHEDULED FOR [facility] [place]. ADDRESS IS [address] YOU WILL NEED TO DRINK THE CONTRAST NOW BECAUSE YOUR APPOINTMENT IS AT 8;30 PM YOU WILL HAVE TO GO INTO THROUGH THE EMERGENCY ROOM AND GO TO ADMITTING.

## 2016-07-27 NOTE — ED Provider Notes (Signed)
[facility] DEPT Provider Note   CSN: [id number] Arrival date & time: 07/27/16  2151     History   Chief Complaint Chief Complaint  Patient presents with  . Pelvic Pain    HPI Lindsay Turner is a 30 y.o. female.  She has a history of GERD. For about the last week, she has had some vague abdominal distress which was generally worse after eating. Over the last 2 weeks, it has more localized to the left lower quadrant. Pain can be as severe as 6/10 after eating, but subsides to 1/10 when not eating. Is also tender to palpation. On appetite has continued to be good. There's been no nausea or vomiting. She does have alternating constipation and diarrhea. There've been no urinary symptoms. She went to urgent care with blood she had a UTI and gave her antibiotics with no improvement. 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 She denies gaining or losing any weight. She denies fever, chills, sweats.   The history is provided by the patient.    Past Medical History:  Diagnosis Date  . GERD (gastroesophageal reflux disease)     There are no active problems to display for this patient.   No past surgical history on file.  OB History    No data available       Home Medications    Prior to Admission medications   Not on File    Family History Family History  Problem Relation Age of Onset  . Hypertension Mother   . Cancer Father   . Diabetes Father   . Heart disease Father   . Hypertension Father   . Cancer Brother   . Heart disease Maternal Grandmother   . Hypertension Maternal Grandmother   . Cancer Maternal Grandfather   . Diabetes Maternal Grandfather   . Heart disease Maternal Grandfather   . Cancer Paternal Grandfather   . Diabetes Paternal Grandfather     Social History Social History  Substance Use Topics  . Smoking status: Never Smoker  . Smokeless  tobacco: Not on file  . Alcohol use Yes     Allergies   Review of patient's allergies indicates no known allergies.   Review of Systems Review of Systems  All other systems reviewed and are negative.    Physical Exam Updated Vital Signs BP 126/83 (BP Location: Left Arm)   Pulse 99   Temp 98.3 F (36.8 C) (Oral)   Resp 18   LMP 07/13/2016 (Approximate)   SpO2 100%   Physical Exam  Nursing note and vitals reviewed.  30 year old female, resting comfortably and in no acute distress. Vital signs are normal. Oxygen saturation is 100%, which is normal. Head is normocephalic and atraumatic. PERRLA, EOMI. Oropharynx is clear. Neck is nontender and supple without adenopathy or JVD. Back is nontender and there is no CVA tenderness. Lungs are clear without rales, wheezes, or rhonchi. Chest is nontender. Heart has regular rate and rhythm without murmur. Abdomen is soft, flat, with mass present in the left lower quadrant. This is moderately tender to palpation. No other masses or tenderness identified. There is no hepatosplenomegaly and peristalsis is normoactive. Extremities have no cyanosis or edema, full range of motion is present. Skin is warm and dry without rash. Neurologic: Mental status is normal, cranial nerves are intact, there are no motor or sensory deficits.  ED  Treatments / Results  Labs (all labs ordered are listed, but only abnormal results are displayed) Labs Reviewed  COMPREHENSIVE METABOLIC PANEL - Abnormal; Notable for the following:       Result Value   Potassium 3.4 (*)    Glucose, Bld 108 (*)    All other components within normal limits     Radiology Dg Abd 1 View  Result Date: 07/27/2016 CLINICAL DATA:  Left side abdominal distention. Loose stools. Symptoms today. EXAM: ABDOMEN - 1 VIEW COMPARISON:  None. FINDINGS: The bowel gas pattern is normal. No radio-opaque calculi or other significant radiographic abnormality are seen. IMPRESSION: Negative exam.  Electronically Signed   By: Inge Rise M.D.   On: 07/27/2016 17:39   US Transvaginal Non-ob  Result Date: 07/28/2016 CLINICAL DATA:  Pelvic mass on CT. Left lower quadrant pain for 6-8 weeks EXAM: TRANSABDOMINAL AND TRANSVAGINAL ULTRASOUND OF PELVIS DOPPLER ULTRASOUND OF OVARIES TECHNIQUE: Both transabdominal and transvaginal ultrasound examinations of the pelvis were performed. Transabdominal technique was performed for global imaging of the pelvis including uterus, ovaries, adnexal regions, and pelvic cul-de-sac. It was necessary to proceed with endovaginal exam following the transabdominal exam to visualize the uterus and ovaries. Color and duplex Doppler ultrasound was utilized to evaluate blood flow to the ovaries. COMPARISON:  CT 07/27/2016 FINDINGS: Uterus Measurements: 8.3 x 2.4 x 2.7 cm. No fibroids or other mass visualized. Endometrium Thickness: 7.3 mm.  No focal abnormality visualized. Right ovary Poorly visualized due to large pelvic mass. Left ovary Poorly visualized due to large pelvic mass. Within the pelvis, extending from the umbilicus to the low pelvis on the left side is a large complex cystic and solid mass. This measures at least 26 x 12.5 x 16.4 cm. Mass contains arterial and venous flow. Other findings No abnormal free fluid. IMPRESSION: 1. Large complex cystic and solid pelvic mass extending from the umbilicus to the lower pelvis, more on the left side. 2. Bilateral ovaries could not be reliably identified due to the large mass. Electronically Signed   By: Donavan Foil M.D.   On: 07/28/2016 00:37   US Pelvis Complete  Result Date: 07/28/2016 CLINICAL DATA:  Pelvic mass on CT. Left lower quadrant pain for 6-8 weeks EXAM: TRANSABDOMINAL AND TRANSVAGINAL ULTRASOUND OF PELVIS DOPPLER ULTRASOUND OF OVARIES TECHNIQUE: Both transabdominal and transvaginal ultrasound examinations of the pelvis were performed. Transabdominal technique was performed for global imaging of the pelvis  including uterus, ovaries, adnexal regions, and pelvic cul-de-sac. It was necessary to proceed with endovaginal exam following the transabdominal exam to visualize the uterus and ovaries. Color and duplex Doppler ultrasound was utilized to evaluate blood flow to the ovaries. COMPARISON:  CT 07/27/2016 FINDINGS: Uterus Measurements: 8.3 x 2.4 x 2.7 cm. No fibroids or other mass visualized. Endometrium Thickness: 7.3 mm.  No focal abnormality visualized. Right ovary Poorly visualized due to large pelvic mass. Left ovary Poorly visualized due to large pelvic mass. Within the pelvis, extending from the umbilicus to the low pelvis on the left side is a large complex cystic and solid mass. This measures at least 26 x 12.5 x 16.4 cm. Mass contains arterial and venous flow. Other findings No abnormal free fluid. IMPRESSION: 1. Large complex cystic and solid pelvic mass extending from the umbilicus to the lower pelvis, more on the left side. 2. Bilateral ovaries could not be reliably identified due to the large mass. Electronically Signed   By: Donavan Foil M.D.   On: 07/28/2016 00:37   Ct  Abdomen Pelvis W Contrast  Result Date: 07/27/2016 CLINICAL DATA:  Left side abdominal pain for few days. Abdominal distention for several months. EXAM: CT ABDOMEN AND PELVIS WITH CONTRAST TECHNIQUE: Multidetector CT imaging of the abdomen and pelvis was performed using the standard protocol following bolus administration of intravenous contrast. CONTRAST:  100 ml ISOVUE-300 IOPAMIDOL (ISOVUE-300) INJECTION 61% COMPARISON:  None. FINDINGS: Lower chest: Clear.  No pleural or pericardial effusion. Hepatobiliary: Unremarkable. Pancreas: Appears normal. Spleen: Appears normal. Adrenals/Urinary Tract: Appear normal. Stomach/Bowel: Appear normal. Vascular/Lymphatic: Unremarkable. Reproductive: There is a lesion which measures approximately 28 cm craniocaudal by up to 14 cm transverse by 12 cm AP which is predominantly cystic but has  calcifications and areas of fat within it. It is unclear from which ovary this lesion emanates. The uterus appears normal. Both and ovaries abut this large lesion. The lesion also has mass effect upon the sigmoid colon. Other: None. Musculoskeletal: No bony abnormality. 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 It is unclear from which ovary the lesion emanates. Doppler ultrasound could be used to exclude torsion as the patient has pain. Referral to gynecologic oncology is recommended. The examination is otherwise negative. Electronically Signed   By: Inge Rise M.D.   On: 07/27/2016 20:52   [place] Art/ven Flow Abd Pelv Doppler Limited  Result Date: 07/28/2016 CLINICAL DATA:  Pelvic mass on CT. Left lower quadrant pain for 6-8 weeks EXAM: TRANSABDOMINAL AND TRANSVAGINAL ULTRASOUND OF PELVIS DOPPLER ULTRASOUND OF OVARIES TECHNIQUE: Both transabdominal and transvaginal ultrasound examinations of the pelvis were performed. Transabdominal technique was performed for global imaging of the pelvis including uterus, ovaries, adnexal regions, and pelvic cul-de-sac. It was necessary to proceed with endovaginal exam following the transabdominal exam to visualize the uterus and ovaries. Color and duplex Doppler ultrasound was utilized to evaluate blood flow to the ovaries. COMPARISON:  CT 07/27/2016 FINDINGS: Uterus Measurements: 8.3 x 2.4 x 2.7 cm. No fibroids or other mass visualized. Endometrium Thickness: 7.3 mm.  No focal abnormality visualized. Right ovary Poorly visualized due to large pelvic mass. Left ovary Poorly visualized due to large pelvic mass. Within the pelvis, extending from the umbilicus to the low pelvis on the left side is a large complex cystic and solid mass. This measures at least 26 x 12.5 x 16.4 cm. Mass contains arterial and venous flow. Other findings No abnormal free fluid. IMPRESSION: 1. Large complex cystic and solid pelvic mass extending from the  umbilicus to the lower pelvis, more on the left side. 2. Bilateral ovaries could not be reliably identified due to the large mass. Electronically Signed   By: Donavan Foil M.D.   On: 07/28/2016 00:37    Procedures Procedures (including critical care time)  Medications Ordered in ED Medications - No data to display   Initial Impression / Assessment and Plan / ED Course  I have reviewed the triage vital signs and the nursing notes.  Pertinent labs & imaging results that were available during my care of the patient were reviewed by me and considered in my medical decision making (see chart for details).  Clinical Course   Pelvic mass which apparently is a teratoma based on CT scan done earlier today. Records from urgent care were reviewed and patient was sent here for ultrasound to rule out torsion. Clinically, this seems very unlikely as the patient has relatively minimal pain. Pelvic ultrasound has been ordered. She does have a gynecologist and will be referred back to them for  definitive care.  Ultrasound does not show any evidence of torsion. She is referred to her gynecologist for follow-up. She is given a prescription for oxycodone have acetaminophen to use as needed for pain. Return to ED if pain is not being adequately controlled at home.  Final Clinical Impressions(s) / ED Diagnoses   Final diagnoses:  Pelvic mass in female  Pelvic mass in female    New Prescriptions New Prescriptions   OXYCODONE-ACETAMINOPHEN (PERCOCET) 5-325 MG TABLET    Take 1 tablet by mouth every 4 (four) hours as needed for moderate pain.     Delora Fuel, MD [phone]

## 2016-07-27 NOTE — ED Triage Notes (Signed)
Pt presents from [facility] after having a CT scan done that showed a large teratoma and was sent for [place] to r/o ovarian torsion. Alert and oriented.

## 2016-07-27 NOTE — ED Notes (Signed)
US at bedside

## 2016-07-28 LAB — COMPLETE METABOLIC PANEL WITH GFR
ALT: 22 U/L (ref 6–29)
AST: 18 U/L (ref 10–30)
Albumin: 4.2 g/dL (ref 3.6–5.1)
Alkaline Phosphatase: 88 U/L (ref 33–115)
BUN: 9 mg/dL (ref 7–25)
CALCIUM: 9.8 mg/dL (ref 8.6–10.2)
CHLORIDE: 101 mmol/L (ref 98–110)
CO2: 25 mmol/L (ref 20–31)
Creat: 0.71 mg/dL (ref 0.50–1.10)
GFR, Est Non African American: >89 mL/min (ref >=60)
Glucose, Bld: 90 mg/dL (ref 65–99)
POTASSIUM: 4.3 mmol/L (ref 3.5–5.3)
Sodium: 137 mmol/L (ref 135–146)
Total Bilirubin: 0.5 mg/dL (ref 0.2–1.2)
Total Protein: 7.1 g/dL (ref 6.1–8.1)

## 2016-07-28 LAB — LIPASE: LIPASE: 5 U/L — AB (ref 7–60)

## 2016-07-28 MED ORDER — OXYCODONE-ACETAMINOPHEN 5-325 MG PO TABS: 1.0000 | ORAL_TABLET | ORAL | 0 refills | Status: DC | PRN

## 2016-07-28 NOTE — Discharge Instructions (Signed)
Call your gynecologist to get an appointment as soon as possible. Return if pain gets worse and is not controlled with the prescription pain medication.

## 2016-07-28 NOTE — ED Notes (Signed)
Pt ambulatory and independent at discharge.  Verbalized understanding of discharge instructions 

## 2016-07-30 ENCOUNTER — Encounter: Payer: Self-pay | Admitting: Urgent Care

## 2016-08-12 ENCOUNTER — Encounter: Payer: Self-pay | Admitting: Gynecologic Oncology

## 2016-08-12 ENCOUNTER — Ambulatory Visit: Payer: 59 | Attending: Gynecologic Oncology | Admitting: Gynecologic Oncology

## 2016-08-12 VITALS — BP 124/84 | HR 98 | Temp 98.5°F | Resp 18 | Wt 249.9 lb

## 2016-08-12 DIAGNOSIS — R971 Elevated cancer antigen 125 [CA 125]: Secondary | ICD-10-CM | POA: Diagnosis not present

## 2016-08-12 DIAGNOSIS — N838 Other noninflammatory disorders of ovary, fallopian tube and broad ligament: Secondary | ICD-10-CM

## 2016-08-12 DIAGNOSIS — N839 Noninflammatory disorder of ovary, fallopian tube and broad ligament, unspecified: Secondary | ICD-10-CM | POA: Insufficient documentation

## 2016-08-12 NOTE — Patient Instructions (Signed)
Preparing for your Surgery  Plan for surgery on August 20, 2016 with Dr. Everitt Amber at [place] will be scheduled for an exploratory laparotomy (open incision), unilateral salpingo-oophorectomy, possible staging.  Pre-operative Testing -You will receive a phone call from presurgical testing at [facility] to arrange for a pre-operative testing appointment before your surgery.  This appointment normally occurs one to two weeks before your scheduled surgery.   -Bring your insurance card, copy of an advanced directive if applicable, medication list  -At that visit, you will be asked to sign a consent for a possible blood transfusion in case a transfusion becomes necessary during surgery.  The need for a blood transfusion is rare but having consent is a necessary part of your care.     -You should not be taking blood thinners or aspirin at least ten days prior to surgery unless instructed by your surgeon.  Day Before Surgery at [place] will be asked to take in a light diet the day before surgery.  Avoid carbonated beverages.  You will be advised to have nothing to eat or drink after midnight the evening before.     Eat a light diet the day before surgery.  Examples including soups, broths, toast, yogurt, mashed potatoes.  Things to avoid include carbonated beverages (fizzy beverages), raw fruits and raw vegetables, or beans.    If your bowels are filled with gas, your surgeon will have difficulty visualizing your pelvic organs which increases your surgical risks.  Your role in recovery Your role is to become active as soon as directed by your doctor, while still giving yourself time to heal.  Rest when you feel tired. You will be asked to do the following in order to speed your recovery:  - Cough and breathe deeply. This helps toclear and expand your lungs and can prevent pneumonia. You may be given a spirometer to practice deep breathing. A staff member  will show you how to use the spirometer. - Do mild physical activity. Walking or moving your legs help your circulation and body functions return to normal. A staff member will help you when you try to walk and will provide you with simple exercises. Do not try to get up or walk alone the first time. - Actively manage your pain. Managing your pain lets you move in comfort. We will ask you to rate your pain on a scale of zero to 10. It is your responsibility to tell your doctor or nurse where and how much you hurt so your pain can be treated.  Special Considerations -If you are diabetic, you may be placed on insulin after surgery to have closer control over your blood sugars to promote healing and recovery.  This does not mean that you will be discharged on insulin.  If applicable, your oral antidiabetics will be resumed when you are tolerating a solid diet.  -Your final pathology results from surgery should be available by the Friday after surgery and the results will be relayed to you when available.   Blood Transfusion Information WHAT IS A BLOOD TRANSFUSION? A transfusion is the replacement of blood or some of its parts. Blood is made up of multiple cells which provide different functions.  Red blood cells carry oxygen and are used for blood loss replacement.  White blood cells fight against infection.  Platelets control bleeding.  Plasma helps clot blood.  Other blood products are available for specialized needs, such as hemophilia or other  clotting disorders. BEFORE THE TRANSFUSION  Who gives blood for transfusions?   You may be able to donate blood to be used at a later date on yourself (autologous donation).  Relatives can be asked to donate blood. This is generally not any safer than if you have received blood from a stranger. The same precautions are taken to ensure safety when a relative's blood is donated.  Healthy volunteers who are fully evaluated to make sure their blood  is safe. This is blood bank blood. Transfusion therapy is the safest it has ever been in the practice of medicine. Before blood is taken from a donor, a complete history is taken to make sure that person has no history of diseases nor engages in risky social behavior (examples are intravenous drug use or sexual activity with multiple partners). The donor's travel history is screened to minimize risk of transmitting infections, such as malaria. The donated blood is tested for signs of infectious diseases, such as HIV and hepatitis. The blood is then tested to be sure it is compatible with you in order to minimize the chance of a transfusion reaction. If you or a relative donates blood, this is often done in anticipation of surgery and is not appropriate for emergency situations. It takes many days to process the donated blood. RISKS AND COMPLICATIONS Although transfusion therapy is very safe and saves many lives, the main dangers of transfusion include:   Getting an infectious disease.  Developing a transfusion reaction. This is an allergic reaction to something in the blood you were given. Every precaution is taken to prevent this. The decision to have a blood transfusion has been considered carefully by your caregiver before blood is given. Blood is not given unless the benefits outweigh the risks.

## 2016-08-12 NOTE — Progress Notes (Signed)
Consult Note: Gyn-Onc  Consult was requested by Dr. Radene Knee for the evaluation of Tavonna Rulli 30 y.o. female  CC:  Chief Complaint  Patient presents with  . Complex ovarian mass    New Consultation    Assessment/Plan:  Ms. Joleene Pesola  is a 30 y.o.  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  I reviewed Mystie's images with her and discussed that I recommend salping-oophorectomy via ex lap. I discussed that hysterectomy and contralateral USO is not necessary, even in cases of malignant teratoma. I discussed high rates of cure in the setting of fertility sparing surgery. I discussed that chemotherapy is sometimes recommended if malignancy is identified.  We will add LDH and HCG to her preop labs.  I discussed operative risks including  bleeding, infection, damage to internal organs (such as bladder,ureters, bowels), blood clot, reoperation and rehospitalization. I discussed antiicipated hospital stay and recovery.   HPI: Eloni Dooms is a 30 year old G0 who is seen in consultation at the request of Dr Radene Knee for a 28cm complex cystic and solid pelvic mass.  The patient has a 1 month history of urinary and GI changes. She was seen by an urgent care in October 2017 and initially treated for a UTI. When symptoms persisted despite negative UA, she received a CT abdo/pelvis on 07/27/16. It showed a 28x14cm complex mass with calcifications arising from likely the left ovary. The uterus was normal. No ascites/lymphadenopathy/carcinomatosis.  Pelvic US was performed on 07/27/16 and confirmed a 26x12.5x16.4cm mass from the likely left ovary with calcifications and arterial and venous flow.   The patient denies pain, but has pressure and fullness.  The patient has a family history significant for a brother with bone cancer,  a father with non hodgkins lymphoma and a cousin with pediatric malignancy.  She has never been pregnant and desires future fertility (she is engaged).  The patient has no major medical problems. She works at an [employer].  Current Meds:  Outpatient Encounter Prescriptions as of 08/12/2016  Medication Sig  . acetaminophen (TYLENOL) 500 MG tablet Take 500 mg by mouth every 6 (six) hours as needed for moderate pain.  . [DISCONTINUED] oxyCODONE-acetaminophen (PERCOCET) 5-325 MG tablet Take 1 tablet by mouth every 4 (four) hours as needed for moderate pain.   No facility-administered encounter medications on file as of 08/12/2016.     Allergy: No Known Allergies  Social Hx:   Social History   Social History  . Marital status: Single    Spouse name: N/A  . Number of children: N/A  . Years of education: N/A   Occupational History  . Not on file.   Social History Main Topics  . Smoking status: Never Smoker  . Smokeless tobacco: Never Used  . Alcohol use Yes     Comment: occ  . Drug use: No  . Sexual activity: Not on file   Other Topics Concern  . Not on file   Social History Narrative  . No narrative on file    Past Surgical Hx: History reviewed. No pertinent surgical history.  Past Medical Hx:  Past Medical History:  Diagnosis Date  . GERD (gastroesophageal reflux disease)     Past Gynecological History:  G0, no abnormal paps. Regular menses Patient's last menstrual period was 07/13/2016 (approximate).  Family Hx:  Family  History  Problem Relation Age of Onset  . Hypertension Mother   . Cancer Father   . Diabetes Father   . Heart disease Father   . Hypertension Father   . Cancer Brother   . Heart disease Maternal Grandmother   . Hypertension Maternal Grandmother   . Cancer Maternal Grandfather   . Diabetes Maternal Grandfather   . Heart disease Maternal Grandfather   . Cancer Paternal Grandfather   . Diabetes Paternal Grandfather     Review of  Systems:  Constitutional  Feels well,    ENT Normal appearing ears and nares bilaterally Skin/Breast  No rash, sores, jaundice, itching, dryness Cardiovascular  No chest pain, shortness of breath, or edema  Pulmonary  No cough or wheeze.  Gastro Intestinal  + bloating and urinary frequency and constipation. Genito Urinary  No frequency, urgency, dysuria,  Musculo Skeletal  No myalgia, arthralgia, joint swelling or pain  Neurologic  No weakness, numbness, change in gait,  Psychology  No depression, anxiety, insomnia.   Vitals:  Blood pressure 124/84, pulse 98, temperature 98.5 F (36.9 C), temperature source Oral, resp. rate 18, weight 249 lb 14.4 oz (113.4 kg), last menstrual period 07/13/2016, SpO2 99 %.  Physical Exam: WD in NAD Neck  Supple NROM, without any enlargements.  Lymph Node Survey No cervical supraclavicular or inguinal adenopathy Cardiovascular  Pulse normal rate, regularity and rhythm. S1 and S2 normal.  Lungs  Clear to auscultation bilateraly, without wheezes/crackles/rhonchi. Good air movement.  Skin  No rash/lesions/breakdown  Psychiatry  Alert and oriented to person, place, and time  Abdomen  Normoactive bowel sounds, abdomen soft, non-tender and overweight without evidence of hernia. Palpable firm mobile mass filling pelvis and abdomen. Back No CVA tenderness Genito Urinary  Vulva/vagina: Normal external female genitalia.  No lesions. No discharge or bleeding.  Bladder/urethra:  No lesions or masses, well supported bladder  Vagina: normal  Cervix: Normal appearing, no lesions.  Uterus:  Small, mobile, no parametrial involvement or nodularity.  Adnexa: difficult to appreciate the pelvic mass. Rectal  Good tone, no masses no cul de sac nodularity. Unable to appreciate the mass Extremities  No bilateral cyanosis, clubbing or edema.   [name], MD  08/12/2016, 11:27 AM

## 2016-08-14 NOTE — Patient Instructions (Signed)
Lindsay Turner  08/14/2016   Your procedure is scheduled on: 08/20/2016    Report to [facility] Main  Entrance take [place]  elevators to 3rd floor to  [place] at   [place] AM.  Call this number if you have problems the morning of surgery [phone]   Remember: ONLY 1 PERSON MAY GO WITH YOU TO SHORT STAY TO GET  READY MORNING OF YOUR SURGERY.  Do not eat food or drink liquids :After Midnight.             Eat a light diet the day before surgery.  Examples include: toast, soups, broths, yogurt and mashed potatoes.  Avoid carbonated beverages.  No raw fruits , vegetables or beans the day before surgery.      Take these medicines the morning of surgery with A SIP OF WATER: none  DO NOT TAKE ANY DIABETIC MEDICATIONS DAY OF YOUR SURGERY                               You may not have any metal on your body including hair pins and              piercings  Do not wear jewelry, make-up, lotions, powders or perfumes, deodorant             Do not wear nail polish.  Do not shave  48 hours prior to surgery.               Do not bring valuables to the hospital. [place].  Contacts, dentures or bridgework may not be worn into surgery.  Leave suitcase in the car. After surgery it may be brought to your room.       :  Special Instructions:               Please read over the following fact sheets you were given: _____________________________________________________________________             [facility] - Preparing for Surgery Before surgery, you can play an important role.  Because skin is not sterile, your skin needs to be as free of germs as possible.  You can reduce the number of germs on your skin by washing with CHG (chlorahexidine gluconate) soap before surgery.  CHG is an antiseptic cleaner which kills germs and bonds with the skin to continue killing germs even after washing. Please DO NOT use if you  have an allergy to CHG or antibacterial soaps.  If your skin becomes reddened/irritated stop using the CHG and inform your nurse when you arrive at Short Stay. Do not shave (including legs and underarms) for at least 48 hours prior to the first CHG shower.  You may shave your face/neck. Please follow these instructions carefully:  1.  Shower with CHG Soap the night before surgery and the  morning of Surgery.  2.  If you choose to wash your hair, wash your hair first as usual with your  normal  shampoo.  3.  After you shampoo, rinse your hair and body thoroughly to remove the  shampoo.  4.  Use CHG as you would any other liquid soap.  You can apply chg directly  to the skin and wash                       Gently with a scrungie or clean washcloth.  5.  Apply the CHG Soap to your body ONLY FROM THE NECK DOWN.   Do not use on face/ open                           Wound or open sores. Avoid contact with eyes, ears mouth and genitals (private parts).                       Wash face,  Genitals (private parts) with your normal soap.             6.  Wash thoroughly, paying special attention to the area where your surgery  will be performed.  7.  Thoroughly rinse your body with warm water from the neck down.  8.  DO NOT shower/wash with your normal soap after using and rinsing off  the CHG Soap.                9.  Pat yourself dry with a clean towel.            10.  Wear clean pajamas.            11.  Place clean sheets on your bed the night of your first shower and do not  sleep with pets. Day of Surgery : Do not apply any lotions/deodorants the morning of surgery.  Please wear clean clothes to the hospital/surgery center.  FAILURE TO FOLLOW THESE INSTRUCTIONS MAY RESULT IN THE CANCELLATION OF YOUR SURGERY PATIENT SIGNATURE_________________________________  NURSE  SIGNATURE__________________________________  ________________________________________________________________________  WHAT IS A BLOOD TRANSFUSION? Blood Transfusion Information  A transfusion is the replacement of blood or some of its parts. Blood is made up of multiple cells which provide different functions.  Red blood cells carry oxygen and are used for blood loss replacement.  White blood cells fight against infection.  Platelets control bleeding.  Plasma helps clot blood.  Other blood products are available for specialized needs, such as hemophilia or other clotting disorders. BEFORE THE TRANSFUSION  Who gives blood for transfusions?   Healthy volunteers who are fully evaluated to make sure their blood is safe. This is blood bank blood. Transfusion therapy is the safest it has ever been in the practice of medicine. Before blood is taken from a donor, a complete history is taken to make sure that person has no history of diseases nor engages in risky social behavior (examples are intravenous drug use or sexual activity with multiple partners). The donor's travel history is screened to minimize risk of transmitting infections, such as malaria. The donated blood is tested for signs of infectious diseases, such as HIV and hepatitis. The blood is then tested to be sure it is compatible with you in order to minimize the chance of a transfusion reaction. If you or a relative donates blood, this is often done in anticipation of surgery and is not appropriate for emergency situations. It takes many days to process the donated blood. RISKS AND COMPLICATIONS Although transfusion therapy is very safe and saves many lives, the main dangers of transfusion include:   Getting an infectious disease.  Developing a transfusion reaction. This  is an allergic reaction to something in the blood you were given. Every precaution is taken to prevent this. The decision to have a blood transfusion has been  considered carefully by your caregiver before blood is given. Blood is not given unless the benefits outweigh the risks. AFTER THE TRANSFUSION  Right after receiving a blood transfusion, you will usually feel much better and more energetic. This is especially true if your red blood cells have gotten low (anemic). The transfusion raises the level of the red blood cells which carry oxygen, and this usually causes an energy increase.  The nurse administering the transfusion will monitor you carefully for complications. HOME CARE INSTRUCTIONS  No special instructions are needed after a transfusion. You may find your energy is better. Speak with your caregiver about any limitations on activity for underlying diseases you may have. SEEK MEDICAL CARE IF:   Your condition is not improving after your transfusion.  You develop redness or irritation at the intravenous (IV) site. SEEK IMMEDIATE MEDICAL CARE IF:  Any of the following symptoms occur over the next 12 hours:  Shaking chills.  You have a temperature by mouth above 102 F (38.9 C), not controlled by medicine.  Chest, back, or muscle pain.  People around you feel you are not acting correctly or are confused.  Shortness of breath or difficulty breathing.  Dizziness and fainting.  You get a rash or develop hives.  You have a decrease in urine output.  Your urine turns a dark color or changes to pink, red, or brown. Any of the following symptoms occur over the next 10 days:  You have a temperature by mouth above 102 F (38.9 C), not controlled by medicine.  Shortness of breath.  Weakness after normal activity.  The white part of the eye turns yellow (jaundice).  You have a decrease in the amount of urine or are urinating less often.  Your urine turns a dark color or changes to pink, red, or brown. Document Released: 10/02/2000 Document Revised: 12/28/2011 Document Reviewed: 05/21/2008 ExitCare Patient Information 2014  [place].  _______________________________________________________________________  Incentive Spirometer  An incentive spirometer is a tool that can help keep your lungs clear and active. This tool measures how well you are filling your lungs with each breath. Taking long deep breaths may help reverse or decrease the chance of developing breathing (pulmonary) problems (especially infection) following:  A long period of time when you are unable to move or be active. BEFORE THE PROCEDURE   If the spirometer includes an indicator to show your best effort, your nurse or respiratory therapist will set it to a desired goal.  If possible, sit up straight or lean slightly forward. Try not to slouch.  Hold the incentive spirometer in an upright position. INSTRUCTIONS FOR USE  1. Sit on the edge of your bed if possible, or sit up as far as you can in bed or on a chair. 2. Hold the incentive spirometer in an upright position. 3. Breathe out normally. 4. Place the mouthpiece in your mouth and seal your lips tightly around it. 5. Breathe in slowly and as deeply as possible, raising the piston or the ball toward the top of the column. 6. Hold your breath for 3-5 seconds or for as long as possible. Allow the piston or ball to fall to the bottom of the column. 7. Remove the mouthpiece from your mouth and breathe out normally. 8. Rest for a few seconds and repeat Steps 1  through 7 at least 10 times every 1-2 hours when you are awake. Take your time and take a few normal breaths between deep breaths. 9. The spirometer may include an indicator to show your best effort. Use the indicator as a goal to work toward during each repetition. 10. After each set of 10 deep breaths, practice coughing to be sure your lungs are clear. If you have an incision (the cut made at the time of surgery), support your incision when coughing by placing a pillow or rolled up towels firmly against it. Once you are able to get  out of bed, walk around indoors and cough well. You may stop using the incentive spirometer when instructed by your caregiver.  RISKS AND COMPLICATIONS  Take your time so you do not get dizzy or light-headed.  If you are in pain, you may need to take or ask for pain medication before doing incentive spirometry. It is harder to take a deep breath if you are having pain. AFTER USE  Rest and breathe slowly and easily.  It can be helpful to keep track of a log of your progress. Your caregiver can provide you with a simple table to help with this. If you are using the spirometer at home, follow these instructions: [place] IF:   You are having difficultly using the spirometer.  You have trouble using the spirometer as often as instructed.  Your pain medication is not giving enough relief while using the spirometer.  You develop fever of 100.5 F (38.1 C) or higher. SEEK IMMEDIATE MEDICAL CARE IF:   You cough up bloody sputum that had not been present before.  You develop fever of 102 F (38.9 C) or greater.  You develop worsening pain at or near the incision site. MAKE SURE YOU:   Understand these instructions.  Will watch your condition.  Will get help right away if you are not doing well or get worse. Document Released: 02/15/2007 Document Revised: 12/28/2011 Document Reviewed: 04/18/2007 [facility] Patient Information 2014 [place], [place].   ________________________________________________________________________

## 2016-08-17 ENCOUNTER — Encounter (HOSPITAL_COMMUNITY)
Admission: RE | Admit: 2016-08-17 | Discharge: 2016-08-17 | Disposition: A | Discharge status: Home / Self Care | Payer: 59 | Source: Ambulatory Visit | Attending: Gynecologic Oncology | Admitting: Gynecologic Oncology

## 2016-08-17 ENCOUNTER — Encounter (HOSPITAL_COMMUNITY): Payer: Self-pay

## 2016-08-17 ENCOUNTER — Encounter (INDEPENDENT_AMBULATORY_CARE_PROVIDER_SITE_OTHER): Payer: Self-pay

## 2016-08-17 DIAGNOSIS — Z01818 Encounter for other preprocedural examination: Secondary | ICD-10-CM | POA: Insufficient documentation

## 2016-08-17 DIAGNOSIS — Z01812 Encounter for preprocedural laboratory examination: Secondary | ICD-10-CM | POA: Insufficient documentation

## 2016-08-17 DIAGNOSIS — N838 Other noninflammatory disorders of ovary, fallopian tube and broad ligament: Secondary | ICD-10-CM

## 2016-08-17 HISTORY — DX: Headache: R51

## 2016-08-17 HISTORY — DX: Headache, unspecified: R51.9

## 2016-08-17 LAB — CBC WITH DIFFERENTIAL/PLATELET
Basophils Absolute: 0 10*3/uL (ref 0.0–0.1)
Basophils Relative: 0 %
EOS PCT: 2 %
Eosinophils Absolute: 0.2 10*3/uL (ref 0.0–0.7)
HCT: 38.2 % (ref 36.0–46.0)
Hemoglobin: 12.9 g/dL (ref 12.0–15.0)
LYMPHS ABS: 1.5 10*3/uL (ref 0.7–4.0)
LYMPHS PCT: 15 %
MCH: 28.3 pg (ref 26.0–34.0)
MCHC: 33.8 g/dL (ref 30.0–36.0)
MCV: 83.8 fL (ref 78.0–100.0)
MONO ABS: 0.6 10*3/uL (ref 0.1–1.0)
Monocytes Relative: 6 %
Neutro Abs: 7.6 10*3/uL (ref 1.7–7.7)
Neutrophils Relative %: 77 %
PLATELETS: 286 10*3/uL (ref 150–400)
RBC: 4.56 MIL/uL (ref 3.87–5.11)
RDW: 13.2 % (ref 11.5–15.5)
WBC: 9.9 10*3/uL (ref 4.0–10.5)

## 2016-08-17 LAB — URINE MICROSCOPIC-ADD ON

## 2016-08-17 LAB — PREGNANCY, URINE: PREG TEST UR: NEGATIVE

## 2016-08-17 LAB — COMPREHENSIVE METABOLIC PANEL
ALT: 23 U/L (ref 14–54)
AST: 20 U/L (ref 15–41)
Albumin: 4.2 g/dL (ref 3.5–5.0)
Alkaline Phosphatase: 89 U/L (ref 38–126)
Anion gap: 7 (ref 5–15)
BUN: 10 mg/dL (ref 6–20)
CHLORIDE: 104 mmol/L (ref 101–111)
CO2: 26 mmol/L (ref 22–32)
CREATININE: 0.71 mg/dL (ref 0.44–1.00)
Calcium: 9.7 mg/dL (ref 8.9–10.3)
Glucose, Bld: 99 mg/dL (ref 65–99)
POTASSIUM: 4.3 mmol/L (ref 3.5–5.1)
Sodium: 137 mmol/L (ref 135–145)
TOTAL PROTEIN: 7.7 g/dL (ref 6.5–8.1)
Total Bilirubin: 0.7 mg/dL (ref 0.3–1.2)

## 2016-08-17 LAB — URINALYSIS, ROUTINE W REFLEX MICROSCOPIC
Bilirubin Urine: NEGATIVE
Glucose, UA: NEGATIVE mg/dL
KETONES UR: NEGATIVE mg/dL
Nitrite: NEGATIVE
PROTEIN: NEGATIVE mg/dL
Specific Gravity, Urine: 1.011 (ref 1.005–1.030)
pH: 6 (ref 5.0–8.0)

## 2016-08-17 LAB — LACTATE DEHYDROGENASE: LDH: 165 U/L (ref 98–192)

## 2016-08-17 LAB — ABO/RH: ABO/RH(D): O POS

## 2016-08-17 NOTE — Progress Notes (Signed)
U/A and micor done 08/17/16 faxed via EPIC to [facility] [place] and Zoila Shutter.

## 2016-08-18 LAB — BETA HCG QUANT (REF LAB): Beta hCG, Tumor Marker: <1 m[IU]/mL

## 2016-08-20 ENCOUNTER — Encounter (HOSPITAL_COMMUNITY): Payer: Self-pay | Admitting: Certified Registered Nurse Anesthetist

## 2016-08-20 ENCOUNTER — Encounter (HOSPITAL_COMMUNITY)
Admission: AD | Disposition: A | Discharge status: Home / Self Care | Payer: Self-pay | Source: Ambulatory Visit | Attending: Gynecologic Oncology

## 2016-08-20 ENCOUNTER — Ambulatory Visit (HOSPITAL_COMMUNITY): Payer: 59 | Admitting: Certified Registered Nurse Anesthetist

## 2016-08-20 ENCOUNTER — Inpatient Hospital Stay (HOSPITAL_COMMUNITY)
Admission: AD | Admit: 2016-08-20 | Discharge: 2016-08-23 | DRG: 743 | Disposition: A | Discharge status: Home / Self Care | Payer: 59 | Source: Ambulatory Visit | Attending: Gynecologic Oncology | Admitting: Gynecologic Oncology

## 2016-08-20 DIAGNOSIS — D271 Benign neoplasm of left ovary: Principal | ICD-10-CM | POA: Diagnosis present

## 2016-08-20 DIAGNOSIS — N838 Other noninflammatory disorders of ovary, fallopian tube and broad ligament: Secondary | ICD-10-CM

## 2016-08-20 DIAGNOSIS — N839 Noninflammatory disorder of ovary, fallopian tube and broad ligament, unspecified: Secondary | ICD-10-CM

## 2016-08-20 DIAGNOSIS — R1900 Intra-abdominal and pelvic swelling, mass and lump, unspecified site: Secondary | ICD-10-CM | POA: Diagnosis present

## 2016-08-20 HISTORY — PX: SALPINGOOPHORECTOMY: SHX82

## 2016-08-20 HISTORY — PX: LAPAROTOMY: SHX154

## 2016-08-20 LAB — TYPE AND SCREEN
ABO/RH(D): O POS
Antibody Screen: NEGATIVE

## 2016-08-20 SURGERY — LAPAROTOMY, EXPLORATORY
Anesthesia: General

## 2016-08-20 MED ORDER — ONDANSETRON HCL 4 MG/2ML IJ SOLN
INTRAMUSCULAR | Status: DC | PRN
  Administered 2016-08-20: 4 mg via INTRAVENOUS

## 2016-08-20 MED ORDER — FENTANYL CITRATE (PF) 100 MCG/2ML IJ SOLN
INTRAMUSCULAR | Status: AC
  Administered 2016-08-20: 50 ug via INTRAVENOUS
  Filled 2016-08-20: qty 2

## 2016-08-20 MED ORDER — ROCURONIUM BROMIDE 50 MG/5ML IV SOSY
PREFILLED_SYRINGE | INTRAVENOUS | Status: AC
  Filled 2016-08-20: qty 5

## 2016-08-20 MED ORDER — DEXAMETHASONE SODIUM PHOSPHATE 10 MG/ML IJ SOLN
INTRAMUSCULAR | Status: AC
  Filled 2016-08-20: qty 1

## 2016-08-20 MED ORDER — FENTANYL CITRATE (PF) 100 MCG/2ML IJ SOLN
INTRAMUSCULAR | Status: DC | PRN
  Administered 2016-08-20 (×2): 50 ug via INTRAVENOUS

## 2016-08-20 MED ORDER — ONDANSETRON HCL 4 MG/2ML IJ SOLN
INTRAMUSCULAR | Status: AC
  Filled 2016-08-20: qty 2

## 2016-08-20 MED ORDER — SUGAMMADEX SODIUM 200 MG/2ML IV SOLN
INTRAVENOUS | Status: AC
  Filled 2016-08-20: qty 4

## 2016-08-20 MED ORDER — ACETAMINOPHEN 10 MG/ML IV SOLN
INTRAVENOUS | Status: AC
  Filled 2016-08-20: qty 100

## 2016-08-20 MED ORDER — 0.9 % SODIUM CHLORIDE (POUR BTL) OPTIME
TOPICAL | Status: DC | PRN
  Administered 2016-08-20: 2000 mL

## 2016-08-20 MED ORDER — KETOROLAC TROMETHAMINE 30 MG/ML IJ SOLN
15.0000 mg | Freq: Four times a day (QID) | INTRAMUSCULAR | Status: AC
  Administered 2016-08-20 – 2016-08-21 (×3): 15 mg via INTRAVENOUS
  Filled 2016-08-20 (×3): qty 1

## 2016-08-20 MED ORDER — ONDANSETRON HCL 4 MG/2ML IJ SOLN: 4.0000 mg | Freq: Four times a day (QID) | INTRAMUSCULAR | Status: DC | PRN

## 2016-08-20 MED ORDER — ACETAMINOPHEN 10 MG/ML IV SOLN
INTRAVENOUS | Status: DC | PRN
  Administered 2016-08-20: 1000 mg via INTRAVENOUS

## 2016-08-20 MED ORDER — HEMOSTATIC AGENTS (NO CHARGE) OPTIME
TOPICAL | Status: DC | PRN
  Administered 2016-08-20: 1 via TOPICAL

## 2016-08-20 MED ORDER — LIDOCAINE HCL (CARDIAC) 20 MG/ML IV SOLN
INTRAVENOUS | Status: DC | PRN
  Administered 2016-08-20: 100 mg via INTRAVENOUS

## 2016-08-20 MED ORDER — BUPIVACAINE HCL (PF) 0.25 % IJ SOLN
INTRAMUSCULAR | Status: AC
  Filled 2016-08-20: qty 30

## 2016-08-20 MED ORDER — DEXAMETHASONE SODIUM PHOSPHATE 10 MG/ML IJ SOLN
INTRAMUSCULAR | Status: DC | PRN
  Administered 2016-08-20: 10 mg via INTRAVENOUS

## 2016-08-20 MED ORDER — FENTANYL CITRATE (PF) 100 MCG/2ML IJ SOLN
INTRAMUSCULAR | Status: AC
  Filled 2016-08-20: qty 2

## 2016-08-20 MED ORDER — OXYCODONE HCL 5 MG PO TABS
5.0000 mg | ORAL_TABLET | ORAL | Status: DC | PRN
  Administered 2016-08-20 – 2016-08-23 (×10): 5 mg via ORAL
  Filled 2016-08-20 (×10): qty 1

## 2016-08-20 MED ORDER — KETOROLAC TROMETHAMINE 30 MG/ML IJ SOLN
30.0000 mg | Freq: Once | INTRAMUSCULAR | Status: AC
  Administered 2016-08-20: 30 mg via INTRAVENOUS

## 2016-08-20 MED ORDER — IBUPROFEN 800 MG PO TABS
800.0000 mg | ORAL_TABLET | Freq: Four times a day (QID) | ORAL | Status: DC
  Administered 2016-08-21 – 2016-08-23 (×5): 800 mg via ORAL
  Filled 2016-08-20 (×6): qty 1

## 2016-08-20 MED ORDER — SODIUM CHLORIDE 0.9 % IJ SOLN
INTRAMUSCULAR | Status: DC | PRN
  Administered 2016-08-20: 40 mL

## 2016-08-20 MED ORDER — MAGNESIUM HYDROXIDE 400 MG/5ML PO SUSP
30.0000 mL | Freq: Three times a day (TID) | ORAL | Status: AC
  Administered 2016-08-20 – 2016-08-21 (×3): 30 mL via ORAL
  Filled 2016-08-20 (×4): qty 30

## 2016-08-20 MED ORDER — FENTANYL CITRATE (PF) 100 MCG/2ML IJ SOLN
25.0000 ug | INTRAMUSCULAR | Status: DC | PRN
  Administered 2016-08-20 (×2): 50 ug via INTRAVENOUS

## 2016-08-20 MED ORDER — ROCURONIUM BROMIDE 100 MG/10ML IV SOLN
INTRAVENOUS | Status: DC | PRN
  Administered 2016-08-20 (×2): 10 mg via INTRAVENOUS
  Administered 2016-08-20: 50 mg via INTRAVENOUS

## 2016-08-20 MED ORDER — ACETAMINOPHEN 500 MG PO TABS
1000.0000 mg | ORAL_TABLET | Freq: Four times a day (QID) | ORAL | Status: DC
  Administered 2016-08-20 – 2016-08-23 (×11): 1000 mg via ORAL
  Filled 2016-08-20 (×11): qty 2

## 2016-08-20 MED ORDER — ENOXAPARIN SODIUM 40 MG/0.4ML ~~LOC~~ SOLN
40.0000 mg | SUBCUTANEOUS | Status: AC
  Administered 2016-08-20: 40 mg via SUBCUTANEOUS
  Filled 2016-08-20: qty 0.4

## 2016-08-20 MED ORDER — SUFENTANIL CITRATE 50 MCG/ML IV SOLN
INTRAVENOUS | Status: DC | PRN
  Administered 2016-08-20 (×5): 10 ug via INTRAVENOUS

## 2016-08-20 MED ORDER — ONDANSETRON HCL 4 MG PO TABS: 4.0000 mg | ORAL_TABLET | Freq: Four times a day (QID) | ORAL | Status: DC | PRN

## 2016-08-20 MED ORDER — KETOROLAC TROMETHAMINE 30 MG/ML IJ SOLN
INTRAMUSCULAR | Status: AC
  Filled 2016-08-20: qty 1

## 2016-08-20 MED ORDER — METOCLOPRAMIDE HCL 5 MG/ML IJ SOLN: 10.0000 mg | Freq: Once | INTRAMUSCULAR | Status: DC | PRN

## 2016-08-20 MED ORDER — DEXTROSE 5 % IV SOLN
2.0000 g | INTRAVENOUS | Status: AC
  Administered 2016-08-20: 2 g via INTRAVENOUS
  Filled 2016-08-20: qty 2

## 2016-08-20 MED ORDER — MIDAZOLAM HCL 5 MG/5ML IJ SOLN
INTRAMUSCULAR | Status: DC | PRN
  Administered 2016-08-20: 0.5 mg via INTRAVENOUS
  Administered 2016-08-20: 1 mg via INTRAVENOUS
  Administered 2016-08-20: 0.5 mg via INTRAVENOUS

## 2016-08-20 MED ORDER — PROPOFOL 10 MG/ML IV BOLUS
INTRAVENOUS | Status: AC
Start: 2016-08-20 — End: 2016-08-20
  Filled 2016-08-20: qty 20

## 2016-08-20 MED ORDER — LACTATED RINGERS IV SOLN
INTRAVENOUS | Status: DC | PRN
  Administered 2016-08-20: 12:00:00 via INTRAVENOUS

## 2016-08-20 MED ORDER — MIDAZOLAM HCL 2 MG/2ML IJ SOLN
INTRAMUSCULAR | Status: AC
  Filled 2016-08-20: qty 2

## 2016-08-20 MED ORDER — SODIUM CHLORIDE 0.9 % IJ SOLN
INTRAMUSCULAR | Status: AC
  Filled 2016-08-20: qty 50

## 2016-08-20 MED ORDER — LIDOCAINE 2% (20 MG/ML) 5 ML SYRINGE
INTRAMUSCULAR | Status: AC
  Filled 2016-08-20: qty 5

## 2016-08-20 MED ORDER — PROPOFOL 10 MG/ML IV BOLUS
INTRAVENOUS | Status: DC | PRN
  Administered 2016-08-20: 170 mg via INTRAVENOUS
  Administered 2016-08-20: 30 mg via INTRAVENOUS

## 2016-08-20 MED ORDER — GABAPENTIN 300 MG PO CAPS
600.0000 mg | ORAL_CAPSULE | Freq: Every day | ORAL | Status: AC
  Administered 2016-08-20: 600 mg via ORAL
  Filled 2016-08-20 (×2): qty 2

## 2016-08-20 MED ORDER — BUPIVACAINE HCL 0.25 % IJ SOLN
INTRAMUSCULAR | Status: DC | PRN
  Administered 2016-08-20: 40 mL

## 2016-08-20 MED ORDER — LACTATED RINGERS IV SOLN
INTRAVENOUS | Status: DC
  Administered 2016-08-20 (×3): via INTRAVENOUS

## 2016-08-20 MED ORDER — SUFENTANIL CITRATE 50 MCG/ML IV SOLN
INTRAVENOUS | Status: AC
  Filled 2016-08-20: qty 1

## 2016-08-20 MED ORDER — MEPERIDINE HCL 50 MG/ML IJ SOLN: 6.2500 mg | INTRAMUSCULAR | Status: DC | PRN

## 2016-08-20 MED ORDER — SUGAMMADEX SODIUM 500 MG/5ML IV SOLN
INTRAVENOUS | Status: DC | PRN
  Administered 2016-08-20: 200 mg via INTRAVENOUS

## 2016-08-20 MED ORDER — HYDROMORPHONE HCL 1 MG/ML IJ SOLN
0.5000 mg | INTRAMUSCULAR | Status: DC | PRN
  Administered 2016-08-20: 0.5 mg via INTRAVENOUS
  Filled 2016-08-20: qty 1

## 2016-08-20 MED ORDER — ENSURE ENLIVE PO LIQD
237.0000 mL | Freq: Two times a day (BID) | ORAL | Status: DC
  Administered 2016-08-20 – 2016-08-23 (×6): 237 mL via ORAL

## 2016-08-20 MED ORDER — KETOROLAC TROMETHAMINE 30 MG/ML IJ SOLN
15.0000 mg | Freq: Four times a day (QID) | INTRAMUSCULAR | Status: AC
  Filled 2016-08-20 (×2): qty 1

## 2016-08-20 MED ORDER — SODIUM CHLORIDE 0.9 % IJ SOLN
INTRAMUSCULAR | Status: AC
  Filled 2016-08-20: qty 10

## 2016-08-20 MED ORDER — BUPIVACAINE LIPOSOME 1.3 % IJ SUSP
20.0000 mL | Freq: Once | INTRAMUSCULAR | Status: AC
  Administered 2016-08-20: 20 mL
  Filled 2016-08-20: qty 20

## 2016-08-20 MED ORDER — KCL IN DEXTROSE-NACL 20-5-0.45 MEQ/L-%-% IV SOLN
INTRAVENOUS | Status: DC
  Administered 2016-08-20: 50 mL/h via INTRAVENOUS
  Administered 2016-08-20: 17:00:00 via INTRAVENOUS
  Filled 2016-08-20: qty 1000

## 2016-08-20 MED ORDER — LACTATED RINGERS IV SOLN: INTRAVENOUS | Status: DC

## 2016-08-20 SURGICAL SUPPLY — 62 items
ADH SKN CLS APL DERMABOND .7 (GAUZE/BANDAGES/DRESSINGS) ×1
AGENT HMST MTR 8 SURGIFLO (HEMOSTASIS)
ATTRACTOMAT 16X20 MAGNETIC DRP (DRAPES) IMPLANT
BLADE EXTENDED COATED 6.5IN (ELECTRODE) ×2 IMPLANT
CELLS DAT CNTRL 66122 CELL SVR (MISCELLANEOUS) IMPLANT
CHLORAPREP W/TINT 26ML (MISCELLANEOUS) ×2 IMPLANT
CLIP TI LARGE 6 (CLIP) ×2 IMPLANT
CLIP TI MEDIUM 6 (CLIP) ×2 IMPLANT
CLIP TI MEDIUM LARGE 6 (CLIP) ×2 IMPLANT
CONT SPEC 4OZ CLIKSEAL STRL BL (MISCELLANEOUS) IMPLANT
COVER SURGICAL LIGHT HANDLE (MISCELLANEOUS) ×2 IMPLANT
DERMABOND ADVANCED (GAUZE/BANDAGES/DRESSINGS) ×1
DERMABOND ADVANCED .7 DNX12 (GAUZE/BANDAGES/DRESSINGS) IMPLANT
DRAPE INCISE IOBAN 66X45 STRL (DRAPES) IMPLANT
DRAPE WARM FLUID 44X44 (DRAPE) ×2 IMPLANT
DRSG OPSITE POSTOP 4X12 (GAUZE/BANDAGES/DRESSINGS) ×1 IMPLANT
ELECT REM PT RETURN 9FT ADLT (ELECTROSURGICAL) ×2
ELECTRODE REM PT RTRN 9FT ADLT (ELECTROSURGICAL) ×1 IMPLANT
GAUZE SPONGE 4X4 16PLY XRAY LF (GAUZE/BANDAGES/DRESSINGS) IMPLANT
GLOVE BIO SURGEON STRL SZ 6 (GLOVE) ×4 IMPLANT
GLOVE BIO SURGEON STRL SZ 6.5 (GLOVE) ×4 IMPLANT
GOWN STRL REUS W/ TWL LRG LVL3 (GOWN DISPOSABLE) ×2 IMPLANT
GOWN STRL REUS W/TWL LRG LVL3 (GOWN DISPOSABLE) ×4
HEMOSTAT ARISTA ABSORB 3G PWDR (MISCELLANEOUS) IMPLANT
KIT BASIN OR (CUSTOM PROCEDURE TRAY) ×2 IMPLANT
LIGASURE IMPACT 36 18CM CVD LR (INSTRUMENTS) ×1 IMPLANT
LOOP VESSEL MAXI BLUE (MISCELLANEOUS) IMPLANT
NEEDLE HYPO 22GX1.5 SAFETY (NEEDLE) ×4 IMPLANT
NS IRRIG 1000ML POUR BTL (IV SOLUTION) ×4 IMPLANT
PACK GENERAL/GYN (CUSTOM PROCEDURE TRAY) ×2 IMPLANT
RELOAD PROXIMATE 75MM BLUE (ENDOMECHANICALS) IMPLANT
RELOAD PROXIMATE TA60MM BLUE (ENDOMECHANICALS) IMPLANT
RELOAD STAPLE 60 BLU REG PROX (ENDOMECHANICALS) IMPLANT
RELOAD STAPLE 75 3.8 BLU REG (ENDOMECHANICALS) IMPLANT
RETRACTOR WND ALEXIS 18 MED (MISCELLANEOUS) IMPLANT
RETRACTOR WND ALEXIS 25 LRG (MISCELLANEOUS) IMPLANT
RTRCTR WOUND ALEXIS 18CM MED (MISCELLANEOUS)
RTRCTR WOUND ALEXIS 25CM LRG (MISCELLANEOUS)
SEALER TISSUE X1 CVD JAW (INSTRUMENTS) IMPLANT
SHEET LAVH (DRAPES) ×2 IMPLANT
SPOGE SURGIFLO 8M (HEMOSTASIS)
SPONGE LAP 18X18 X RAY DECT (DISPOSABLE) ×1 IMPLANT
SPONGE SURGIFLO 8M (HEMOSTASIS) IMPLANT
STAPLER GUN LINEAR PROX 60 (STAPLE) IMPLANT
STAPLER PROXIMATE 75MM BLUE (STAPLE) IMPLANT
STAPLER VISISTAT 35W (STAPLE) IMPLANT
SURGIFLO W/THROMBIN 8M KIT (HEMOSTASIS) ×1 IMPLANT
SUT MNCRL AB 4-0 PS2 18 (SUTURE) IMPLANT
SUT PDS AB 1 TP1 96 (SUTURE) ×4 IMPLANT
SUT SILK 3 0 SH CR/8 (SUTURE) ×2 IMPLANT
SUT VIC AB 0 CT1 36 (SUTURE) ×8 IMPLANT
SUT VIC AB 2-0 CT1 36 (SUTURE) ×5 IMPLANT
SUT VIC AB 2-0 CT2 27 (SUTURE) ×13 IMPLANT
SUT VIC AB 3-0 CTX 36 (SUTURE) IMPLANT
SUT VIC AB 3-0 SH 18 (SUTURE) IMPLANT
SUT VIC AB 3-0 SH 27 (SUTURE) ×2
SUT VIC AB 3-0 SH 27X BRD (SUTURE) ×1 IMPLANT
SYR 30ML LL (SYRINGE) ×4 IMPLANT
TOWEL OR 17X26 10 PK STRL BLUE (TOWEL DISPOSABLE) ×2 IMPLANT
TOWEL OR NON WOVEN STRL DISP B (DISPOSABLE) ×2 IMPLANT
TRAY FOLEY CATH 14FRSI W/METER (CATHETERS) ×2 IMPLANT
UNDERPAD 30X30 INCONTINENT (UNDERPADS AND DIAPERS) ×2 IMPLANT

## 2016-08-20 NOTE — Anesthesia Procedure Notes (Signed)
Procedure Name: Intubation Date/Time: 08/20/2016 12:50 PM Performed by: Montez Hageman Pre-anesthesia Checklist: Patient identified, Timeout performed, Emergency Drugs available, Suction available and Patient being monitored Patient Re-evaluated:Patient Re-evaluated prior to inductionOxygen Delivery Method: Circle system utilized Preoxygenation: Pre-oxygenation with 100% oxygen Intubation Type: IV induction and Cricoid Pressure applied Ventilation: Mask ventilation without difficulty Laryngoscope Size: Mac and 3 Grade View: Grade II Tube type: Oral Tube size: 7.5 mm Number of attempts: 1 Airway Equipment and Method: Stylet Placement Confirmation: ETT inserted through vocal cords under direct vision,  positive ETCO2 and breath sounds checked- equal and bilateral Secured at: 21 cm Tube secured with: Tape Dental Injury: Teeth and Oropharynx as per pre-operative assessment

## 2016-08-20 NOTE — Anesthesia Preprocedure Evaluation (Signed)
Anesthesia Evaluation  Patient identified by MRN, date of birth, ID band Patient awake    Reviewed: Allergy & Precautions, NPO status , Patient's Chart, lab work & pertinent test results  Airway Mallampati: II  TM Distance: >3 FB Neck ROM: Full    Dental no notable dental hx.    Pulmonary neg pulmonary ROS,    Pulmonary exam normal breath sounds clear to auscultation       Cardiovascular negative cardio ROS Normal cardiovascular exam Rhythm:Regular Rate:Normal     Neuro/Psych negative neurological ROS  negative psych ROS   GI/Hepatic negative GI ROS, Neg liver ROS,   Endo/Other  negative endocrine ROS  Renal/GU negative Renal ROS  negative genitourinary   Musculoskeletal negative musculoskeletal ROS (+)   Abdominal   Peds negative pediatric ROS (+)  Hematology negative hematology ROS (+)   Anesthesia Other Findings   Reproductive/Obstetrics negative OB ROS                             Anesthesia Physical Anesthesia Plan  ASA: I  Anesthesia Plan: General   Post-op Pain Management:    Induction: Intravenous  Airway Management Planned: Oral ETT  Additional Equipment:   Intra-op Plan:   Post-operative Plan: Extubation in OR  Informed Consent: I have reviewed the patients History and Physical, chart, labs and discussed the procedure including the risks, benefits and alternatives for the proposed anesthesia with the patient or authorized representative who has indicated his/her understanding and acceptance.   Dental advisory given  Plan Discussed with: CRNA  Anesthesia Plan Comments:         Anesthesia Quick Evaluation  

## 2016-08-20 NOTE — Op Note (Signed)
PATIENT: Lindsay Turner DATE: 08/20/16   Preop Diagnosis: left ovarian mass  Postoperative Diagnosis: same  Surgery: ex lap, left salpingo-oophorectomy, omentectomy  Surgeons: Everitt Amber, MD; Lahoma Crocker, MD   Anesthesia: General   Estimated blood loss: ml  IVF: 1800  ml   Urine output: [id number] ml   Complications: None   Pathology: left tube and ovary, ascites, omentum  Operative findings: 100cc amber ascites, 26cm left ovarian multicystic and solid mass somewhat adherent to the posterior cul de sac (removed in tact). Normal right tube and ovary, normal uterus. No evidence of extraovarian disease. Frozen section revealed mature teratoma.  Procedure: The patient was identified in the preoperative holding area. Informed consent was signed on the chart. Patient was seen history was reviewed and exam was performed.   The patient was then taken to the operating room and placed in the supine position with SCD hose on. General anesthesia was then induced without difficulty. She was then placed in the dorsolithotomy position. The abdomen was prepped with chlor prep sponges per protocol. Perineum was prepped with Betadine. The vagina was prepped with Betadine a Foley catheter was inserted into the bladder under sterile conditions.  The patient was then draped after the prep was dried. Timeout was performed the patient, procedure, antibiotic, allergy, and length of procedure. A right paramedian incision was and carried down to the underlying fascia using Bovie cautery. The fascia was scored in the fascial incision was extended superiorly and inferiorly using Bovie cautery. The rectus bellies were dissected off the overlying fascia. The peritoneum was tented and entered. The peritoneal incision was extended superiorly and inferiorly with visualization of the underlying peritoneal cavity. The Buchwalter self-retaining retractor was then placed. At the initial placement as well as  at several points during the case the lateral blades were checked to ensure no significant pressure on the psoas bellies. The small and large bowel were packed out of the way of the surgical field with moist laparotomy sponges and malleable retractors were attached to the [place].   The ovarian mass was gently delivered through the abdominal incision. A window was created in the broad ligament to skeletonize the IP ligament which was skeletonized from the sigmoid colon mesentery and suture ligated. The utero-ovarian ligament and IP ligament on the left were cross clamped, cut and suture ligated. 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   Hemostasis was reinforced in the posterior cul de sac with floseal.    Concern was present for immature teratoma pathology and therefore the infracolic omentum was removed by making windows in the omentum with the bovie and using the ligasure device to seal and transect larger pedicles.  The abdomen pelvis were copiously irrigated. The retractor and laparotomy sponges were removed. The fascia was closed using running mass closure of #1 PDS. The subcutaneous tissues were irrigated and made hemostatic. 20 mL of Exparel within 40 mL of 1/4% marcaine and 40 mL of normal saline was injected for postoperative pain control. The skin was closed using subcuticular suture.  All instrument, suture, laparotomy, Ray-Tec, and needle counts were correct x2. The patient tolerated the procedure well and was taken recovery room in stable condition. This is Everitt Amber dictating an operative note on Lindsay Turner.  [name], MD

## 2016-08-20 NOTE — Anesthesia Postprocedure Evaluation (Signed)
Anesthesia Post Note  Patient: Lindsay Turner  Procedure(s) Performed: Procedure(s) (LRB): EXPLORATORY LAPAROTOMY, REMOVAL OF LEFT TUBE AND OVARY, (N/A) UNILATERAL SALPINGO OOPHORECTOMY (N/A)  Patient location during evaluation: PACU Anesthesia Type: General Level of consciousness: awake and alert Pain management: pain level controlled Vital Signs Assessment: post-procedure vital signs reviewed and stable Respiratory status: spontaneous breathing, nonlabored ventilation, respiratory function stable and patient connected to nasal cannula oxygen Cardiovascular status: blood pressure returned to baseline and stable Postop Assessment: no signs of nausea or vomiting Anesthetic complications: no    Last Vitals:  Vitals:   08/20/16 1600 08/20/16 1621  BP: 122/76 129/72  Pulse: 83 95  Resp: 14 16  Temp:  37.2 C    Last Pain:  Vitals:   08/20/16 1545  PainSc: 3                  Montez Hageman

## 2016-08-20 NOTE — Transfer of Care (Signed)
Immediate Anesthesia Transfer of Care Note  Patient: Lindsay Turner  Procedure(s) Performed: Procedure(s): EXPLORATORY LAPAROTOMY, REMOVAL OF LEFT TUBE AND OVARY, (N/A) UNILATERAL SALPINGO OOPHORECTOMY (N/A)  Patient Location: PACU  Anesthesia Type:General  Level of Consciousness: awake, oriented, patient cooperative, lethargic and responds to stimulation  Airway & Oxygen Therapy: Patient Spontanous Breathing and Patient connected to face mask oxygen  Post-op Assessment: Report given to RN and Post -op Vital signs reviewed and stable  Post vital signs: Reviewed and stable  Last Vitals:  Vitals:   08/20/16 1518  BP: (P) 130/63  Temp: (P) 37.1 C    Last Pain:  Vitals:   08/20/16 1015  PainSc: 0-No pain      Patients Stated Pain Goal: 4 ([id number])  Complications: No apparent anesthesia complications

## 2016-08-20 NOTE — Interval H&P Note (Signed)
History and Physical Interval Note:  08/20/2016 10:41 AM  Lindsay Turner  has presented today for surgery, with the diagnosis of COMPLEX OVARIAN MASS  The various methods of treatment have been discussed with the patient and family. After consideration of risks, benefits and other options for treatment, the patient has consented to  Procedure(s): EXPLORATORY LAPAROTOMY POSSIBLE STAGING (N/A) UNILATERAL SALPINGO OOPHORECTOMY (N/A) as a surgical intervention .  The patient's history has been reviewed, patient examined, no change in status, stable for surgery.  I have reviewed the patient's chart and labs.  Questions were answered to the patient's satisfaction.     [name]

## 2016-08-20 NOTE — H&P (View-Only) (Signed)
Consult Note: Gyn-Onc  Consult was requested by Dr. Radene Knee for the evaluation of Lindsay Turner 30 y.o. female  CC:  Chief Complaint  Patient presents with  . Complex ovarian mass    New Consultation    Assessment/Plan:  Lindsay Turner  is a 30 y.o.  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  I reviewed Lindsay Turner's images with her and discussed that I recommend salping-oophorectomy via ex lap. I discussed that hysterectomy and contralateral USO is not necessary, even in cases of malignant teratoma. I discussed high rates of cure in the setting of fertility sparing surgery. I discussed that chemotherapy is sometimes recommended if malignancy is identified.  We will add LDH and HCG to her preop labs.  I discussed operative risks including  bleeding, infection, damage to internal organs (such as bladder,ureters, bowels), blood clot, reoperation and rehospitalization. I discussed antiicipated hospital stay and recovery.   HPI: Lindsay Turner is a 30 year old G0 who is seen in consultation at the request of Dr Radene Knee for a 29cm complex cystic and solid pelvic mass.  The patient has a 1 month history of urinary and GI changes. She was seen by an urgent care in October 2017 and initially treated for a UTI. When symptoms persisted despite negative UA, she received a CT abdo/pelvis on 07/27/16. It showed a 28x14cm complex mass with calcifications arising from likely the left ovary. The uterus was normal. No ascites/lymphadenopathy/carcinomatosis.  Pelvic US was performed on 07/27/16 and confirmed a 26x12.5x16.4cm mass from the likely left ovary with calcifications and arterial and venous flow.   The patient denies pain, but has pressure and fullness.  The patient has a family history significant for a brother with bone cancer,  a father with non hodgkins lymphoma and a cousin with pediatric malignancy.  She has never been pregnant and desires future fertility (she is engaged).  The patient has no major medical problems. She works at an [employer].  Current Meds:  Outpatient Encounter Prescriptions as of 08/12/2016  Medication Sig  . acetaminophen (TYLENOL) 500 MG tablet Take 500 mg by mouth every 6 (six) hours as needed for moderate pain.  . [DISCONTINUED] oxyCODONE-acetaminophen (PERCOCET) 5-325 MG tablet Take 1 tablet by mouth every 4 (four) hours as needed for moderate pain.   No facility-administered encounter medications on file as of 08/12/2016.     Allergy: No Known Allergies  Social Hx:   Social History   Social History  . Marital status: Single    Spouse name: N/A  . Number of children: N/A  . Years of education: N/A   Occupational History  . Not on file.   Social History Main Topics  . Smoking status: Never Smoker  . Smokeless tobacco: Never Used  . Alcohol use Yes     Comment: occ  . Drug use: No  . Sexual activity: Not on file   Other Topics Concern  . Not on file   Social History Narrative  . No narrative on file    Past Surgical Hx: History reviewed. No pertinent surgical history.  Past Medical Hx:  Past Medical History:  Diagnosis Date  . GERD (gastroesophageal reflux disease)     Past Gynecological History:  G0, no abnormal paps. Regular menses Patient's last menstrual period was 07/13/2016 (approximate).  Family Hx:  Family  History  Problem Relation Age of Onset  . Hypertension Mother   . Cancer Father   . Diabetes Father   . Heart disease Father   . Hypertension Father   . Cancer Brother   . Heart disease Maternal Grandmother   . Hypertension Maternal Grandmother   . Cancer Maternal Grandfather   . Diabetes Maternal Grandfather   . Heart disease Maternal Grandfather   . Cancer Paternal Grandfather   . Diabetes Paternal Grandfather     Review of  Systems:  Constitutional  Feels well,    ENT Normal appearing ears and nares bilaterally Skin/Breast  No rash, sores, jaundice, itching, dryness Cardiovascular  No chest pain, shortness of breath, or edema  Pulmonary  No cough or wheeze.  Gastro Intestinal  + bloating and urinary frequency and constipation. Genito Urinary  No frequency, urgency, dysuria,  Musculo Skeletal  No myalgia, arthralgia, joint swelling or pain  Neurologic  No weakness, numbness, change in gait,  Psychology  No depression, anxiety, insomnia.   Vitals:  Blood pressure 124/84, pulse 98, temperature 98.5 F (36.9 C), temperature source Oral, resp. rate 18, weight 249 lb 14.4 oz (113.4 kg), last menstrual period 07/13/2016, SpO2 99 %.  Physical Exam: WD in NAD Neck  Supple NROM, without any enlargements.  Lymph Node Survey No cervical supraclavicular or inguinal adenopathy Cardiovascular  Pulse normal rate, regularity and rhythm. S1 and S2 normal.  Lungs  Clear to auscultation bilateraly, without wheezes/crackles/rhonchi. Good air movement.  Skin  No rash/lesions/breakdown  Psychiatry  Alert and oriented to person, place, and time  Abdomen  Normoactive bowel sounds, abdomen soft, non-tender and overweight without evidence of hernia. Palpable firm mobile mass filling pelvis and abdomen. Back No CVA tenderness Genito Urinary  Vulva/vagina: Normal external female genitalia.  No lesions. No discharge or bleeding.  Bladder/urethra:  No lesions or masses, well supported bladder  Vagina: normal  Cervix: Normal appearing, no lesions.  Uterus:  Small, mobile, no parametrial involvement or nodularity.  Adnexa: difficult to appreciate the pelvic mass. Rectal  Good tone, no masses no cul de sac nodularity. Unable to appreciate the mass Extremities  No bilateral cyanosis, clubbing or edema.   Lindsay Eva, MD  08/12/2016, 11:27 AM

## 2016-08-21 ENCOUNTER — Encounter (HOSPITAL_COMMUNITY): Payer: Self-pay | Admitting: Gynecologic Oncology

## 2016-08-21 ENCOUNTER — Other Ambulatory Visit: Payer: Self-pay | Admitting: Gynecologic Oncology

## 2016-08-21 LAB — BASIC METABOLIC PANEL
ANION GAP: 7 (ref 5–15)
BUN: 8 mg/dL (ref 6–20)
CHLORIDE: 106 mmol/L (ref 101–111)
CO2: 25 mmol/L (ref 22–32)
CREATININE: 0.61 mg/dL (ref 0.44–1.00)
Calcium: 9.1 mg/dL (ref 8.9–10.3)
GFR calc non Af Amer: >60 mL/min (ref >60)
GLUCOSE: 152 mg/dL — AB (ref 65–99)
Potassium: 4.1 mmol/L (ref 3.5–5.1)
Sodium: 138 mmol/L (ref 135–145)

## 2016-08-21 LAB — CBC
HEMATOCRIT: 35 % — AB (ref 36.0–46.0)
HEMOGLOBIN: 11.6 g/dL — AB (ref 12.0–15.0)
MCH: 28.2 pg (ref 26.0–34.0)
MCHC: 33.1 g/dL (ref 30.0–36.0)
MCV: 85.2 fL (ref 78.0–100.0)
Platelets: 289 10*3/uL (ref 150–400)
RBC: 4.11 MIL/uL (ref 3.87–5.11)
RDW: 13.2 % (ref 11.5–15.5)
WBC: 14.7 10*3/uL — ABNORMAL HIGH (ref 4.0–10.5)

## 2016-08-21 MED ORDER — INFLUENZA VAC SPLIT QUAD 0.5 ML IM SUSY
0.5000 mL | PREFILLED_SYRINGE | INTRAMUSCULAR | Status: DC
  Filled 2016-08-21 (×2): qty 0.5

## 2016-08-21 NOTE — Progress Notes (Signed)
1 Day Post-Op Procedure(s) (LRB): EXPLORATORY LAPAROTOMY, REMOVAL OF LEFT TUBE AND OVARY, (N/A) UNILATERAL SALPINGO OOPHORECTOMY (N/A)  Subjective: Patient reports incisional pain     Objective: Vital signs in last 24 hours: Temp:  [98 F (36.7 C)-99 F (37.2 C)] 98 F (36.7 C) (11/03 0604) Pulse Rate:  [83-101] 100 (11/03 0604) Resp:  [14-24] 18 (11/03 0604) BP: (104-136)/(55-76) 120/74 (11/03 0604) SpO2:  [98 %-100 %] 98 % (11/03 0604) Weight:  [245 lb (111.1 kg)] 245 lb (111.1 kg) (11/02 1015)    Intake/Output from previous day: 11/02 0701 - 11/03 0700 In: 2350 [I.V.:2350] Out: 2705 [Urine:2530; Blood:175]  Physical Examination: General: alert Resp: clear to auscultation bilaterally Cardio: regular rate and rhythm, S1, S2 normal, no murmur, click, rub or gallop GI: soft, non-tender; bowel sounds normal; no masses,  no organomegaly; I: C/D/I Extremities: extremities normal, atraumatic, no cyanosis or edema  Labs: WBC/Hgb/Hct/Plts:  14.7/11.6/35.0/289 (11/03 0434) BUN/Cr/glu/ALT/AST/amyl/lip:  8/0.61/--/--/--/--/-- (11/03 0434)   Assessment:  30 y.o. s/p Procedure(s): EXPLORATORY LAPAROTOMY, REMOVAL OF LEFT TUBE AND OVARY, UNILATERAL SALPINGO OOPHORECTOMY: stable Pain:  Pain is well-controlled  oral medications.  GI:  Tolerating po: Yes    FEN: Electrolytes in range  Prophylaxis: intermittent pneumatic compression boots.  Plan: Advance diet Encourage ambulation Discontinue IV fluids Dispo:  Anticipate possible discharge to home tomorrow  LOS: 1 day    Turner,Lindsay Makar A 08/21/2016, 9:55 AM     Patient ID: Lindsay Turner, female   DOB: 03-01-1986, 30 y.o.   MRN: [id number]

## 2016-08-22 LAB — CBC
HEMATOCRIT: 31.4 % — AB (ref 36.0–46.0)
Hemoglobin: 10.1 g/dL — ABNORMAL LOW (ref 12.0–15.0)
MCH: 27.9 pg (ref 26.0–34.0)
MCHC: 32.2 g/dL (ref 30.0–36.0)
MCV: 86.7 fL (ref 78.0–100.0)
Platelets: 244 10*3/uL (ref 150–400)
RBC: 3.62 MIL/uL — ABNORMAL LOW (ref 3.87–5.11)
RDW: 13.7 % (ref 11.5–15.5)
WBC: 9.1 10*3/uL (ref 4.0–10.5)

## 2016-08-22 LAB — BASIC METABOLIC PANEL
Anion gap: 7 (ref 5–15)
BUN: 10 mg/dL (ref 6–20)
CALCIUM: 8.7 mg/dL — AB (ref 8.9–10.3)
CHLORIDE: 106 mmol/L (ref 101–111)
CO2: 25 mmol/L (ref 22–32)
CREATININE: 0.63 mg/dL (ref 0.44–1.00)
GFR calc Af Amer: >60 mL/min (ref >60)
GFR calc non Af Amer: >60 mL/min (ref >60)
GLUCOSE: 92 mg/dL (ref 65–99)
Potassium: 3.8 mmol/L (ref 3.5–5.1)
Sodium: 138 mmol/L (ref 135–145)

## 2016-08-22 NOTE — Progress Notes (Signed)
2 Days Post-Op Procedure(s) (LRB): EXPLORATORY LAPAROTOMY, REMOVAL OF LEFT TUBE AND OVARY, (N/A) UNILATERAL SALPINGO OOPHORECTOMY (N/A)  Subjective: Patient reports incisional pain/+BM     Objective: Vital signs in last 24 hours: Temp:  [98 F (36.7 C)-98.3 F (36.8 C)] 98 F (36.7 C) (11/04 0610) Pulse Rate:  [58-102] 58 (11/04 0610) Resp:  [16-18] 16 (11/04 0610) BP: (98-118)/(67-79) 98/68 (11/04 0610) SpO2:  [97 %-99 %] 97 % (11/04 0610) Last BM Date: 08/21/16  Intake/Output from previous day: 11/03 0701 - 11/04 0700 In: 1559.2 [P.O.:720; I.V.:839.2] Out: 350 [Urine:350]  Physical Examination: General: alert Resp: clear to auscultation bilaterally Cardio: regular rate and rhythm, S1, S2 normal, no murmur, click, rub or gallop GI: soft, non-tender; bowel sounds normal; no masses,  no organomegaly; I: C/D/I Extremities: extremities normal, atraumatic, no cyanosis or edema  Labs: WBC/Hgb/Hct/Plts:  9.1/10.1/31.4/244 (11/04 [id number]) BUN/Cr/glu/ALT/AST/amyl/lip:  10/0.63/--/--/--/--/-- (11/04 [id number])   Assessment:  30 y.o. s/p Procedure(s): EXPLORATORY LAPAROTOMY, REMOVAL OF LEFT TUBE AND OVARY, UNILATERAL SALPINGO OOPHORECTOMY: stable Pain:  Pain is well-controlled  oral medications.  GI:  Tolerating po: Yes    FEN: Electrolytes remain in range  Prophylaxis: intermittent pneumatic compression boots.  Plan: Advance diet Encourage ambulation Dispo:  Anticipate discharge to home tomorrow  LOS: 2 days    JACKSON-MOORE,Angeliah Wisdom A 08/22/2016, 11:02 AM     Patient ID: Lindsay Turner, female   DOB: 1986-03-21, 30 y.o.   MRN: [id number] Patient ID: Lindsay Turner, female   DOB: 1986/04/16, 30 y.o.   MRN: [id number]

## 2016-08-23 MED ORDER — OXYCODONE-ACETAMINOPHEN 5-325 MG PO TABS: 1.0000 | ORAL_TABLET | ORAL | 0 refills | Status: DC | PRN

## 2016-08-23 NOTE — Discharge Instructions (Signed)
Unilateral Salpingo-Oophorectomy, Care After Refer to this sheet in the next few weeks. These instructions provide you with information on caring for yourself after your procedure. Your health care provider may also give you more specific instructions. Your treatment has been planned according to current medical practices, but problems sometimes occur. Call your health care provider if you have any problems or questions after your procedure. WHAT TO EXPECT AFTER THE PROCEDURE After your procedure, it is typical to have the following:  Abdominal pain that can be controlled with pain medicine.  Vaginal spotting.  Constipation. HOME CARE INSTRUCTIONS   Get plenty of rest and sleep.  Only take over-the-counter or prescription medicines as directed by your health care provider. Do not take aspirin. It can cause bleeding.  Keep incision areas clean and dry. Remove or change any bandages (dressings) only as directed by your health care provider.  Follow your health care provider's advice regarding diet.  Drink enough fluids to keep your urine clear or pale yellow.  Limit exercise and activities as directed by your health care provider. Do not lift anything heavier than 5 pounds (2.3 kg) until your health care provider approves.  Do not drive until your health care provider approves.  Do not drink alcohol until your health care provider approves.  Do not have sexual intercourse until your health care provider says it is OK.  Take your temperature twice a day and write it down.  If you become constipated, you may:  Ask your health care provider about taking a mild laxative.  Add more fruit and bran to your diet.  Drink more fluids.  Follow up with your health care provider as directed. SEEK MEDICAL CARE IF:   You have swelling or redness in the incision area.  You develop a rash.  You feel lightheaded.  You have pain that is not controlled with medicine.  You have pain,  swelling, or redness where the IV access tube was placed. SEEK IMMEDIATE MEDICAL CARE IF:  You have a fever.  You develop increasing abdominal pain.  You see pus coming out of the incision, or the incision is separating.  You notice a bad smell coming from the wound or dressing.  You have excessive vaginal bleeding.  You feel sick to your stomach (nauseous) and vomit.  You have leg or chest pain.  You have pain when you urinate.  You develop shortness of breath.  You pass out.   This information is not intended to replace advice given to you by your health care provider. Make sure you discuss any questions you have with your health care provider.   Document Released: 08/01/2009 Document Revised: 07/26/2013 Document Reviewed: 03/29/2013 Elsevier Interactive Patient Education [employer].

## 2016-08-23 NOTE — Discharge Summary (Signed)
Physician Discharge Summary  Patient ID: TIMMEKA TULLOS MRN: [id number] DOB/AGE: 03/01/1986 30 y.o.  Admit date: 08/20/2016 Discharge date: 08/23/2016  Admission Diagnoses: Pelvic mass  Discharge Diagnoses:    Ovarian mass   Discharged Condition: good  Hospital Course: On 08/20/2016, the patient underwent the following: Procedure(s): EXPLORATORY LAPAROTOMY, REMOVAL OF LEFT TUBE AND OVARY, UNILATERAL SALPINGO OOPHORECTOMY.   The postoperative course was uneventful.  She was discharged to home on postoperative day 3  tolerating a regular diet.  Consults: None  Significant Diagnostic Studies: None  Treatments: surgery: see above  Discharge Exam: Blood pressure 116/73, pulse 60, temperature 97.7 F (36.5 C), temperature source Oral, resp. rate 16, height 5\' 10"  (1.778 m), weight 245 lb (111.1 kg), last menstrual period 08/12/2016, SpO2 100 %. General appearance: alert Resp: clear to auscultation bilaterally Cardio: regular rate and rhythm, S1, S2 normal, no murmur, click, rub or gallop GI: soft, non-tender; bowel sounds normal; no masses,  no organomegaly Extremities: extremities normal, atraumatic, no cyanosis or edema Incision/Wound: C/D/I  Disposition: 01-Home or Self Care  Discharge Instructions    Activity as tolerated - No restrictions    Complete by:  As directed    Call MD for:  extreme fatigue    Complete by:  As directed    Call MD for:  persistant dizziness or light-headedness    Complete by:  As directed    Call MD for:  persistant nausea and vomiting    Complete by:  As directed    Call MD for:  redness, tenderness, or signs of infection (pain, swelling, redness, odor or green/yellow discharge around incision site)    Complete by:  As directed    Call MD for:  severe uncontrolled pain    Complete by:  As directed    Call MD for:  temperature >100.4    Complete by:  As directed    Diet general    Complete by:  As directed    Discharge instructions     Complete by:  As directed    Return to work: 2 - 4 weeks  Activity: 1. Be up and out of the bed during the day.  Take a nap if needed.  You may walk up steps but be careful and use the hand rail.  Stair climbing will tire you more than you think, you may need to stop part way and rest.   2. No lifting or straining for 6 weeks.  3. No driving for 1-2 weeks.  Do Not drive if you are taking narcotic pain medicine.  4. Shower daily.  Use soap and water on your incision and pat dry; don't rub.   5. No sexual activity and nothing in the vagina for 4 weeks.  Diet: 1. Low sodium Heart Healthy Diet is recommended.  2. It is safe to use a laxative if you have difficulty moving your bowels.   Wound Care: 1. Keep clean and dry.  Shower daily.  Reasons to call the Doctor:  Fever - Oral temperature greater than 100.4 degrees Fahrenheit Foul-smelling vaginal discharge Difficulty urinating Nausea and vomiting Increased pain at the site of the incision that is unrelieved with pain medicine. Difficulty breathing with or without chest pain New calf pain especially if only on one side Sudden, continuing increased vaginal bleeding with or without clots.   Follow-up: 1. See Everitt Amber in 4 weeks.  Contacts: For questions or concerns you should contact:  Dr. Everitt Amber at [phone]  or at  [facility] [phone]   Discharge wound care:    Complete by:  As directed    Keep clean and dry   Driving Restrictions    Complete by:  As directed    No driving for 1- 2 weeks   Increase activity slowly    Complete by:  As directed    Lifting restrictions    Complete by:  As directed    No lifting > 5 lbs for 6 weeks   May shower / Bathe    Complete by:  As directed    No tub baths for 6 weeks   Sexual Activity Restrictions    Complete by:  As directed    No intercourse for 6 - 8 weeks       Medication List    TAKE these medications   acetaminophen 500 MG tablet Commonly known as:   TYLENOL Take 500-1,000 mg by mouth every 6 (six) hours as needed for moderate pain or headache.   MULTIVITAMIN GUMMIES ADULT Chew Chew 2 tablets by mouth daily.   oxyCODONE-acetaminophen 5-325 MG tablet Commonly known as:  PERCOCET/ROXICET Take 1-2 tablets by mouth every 4 (four) hours as needed for severe pain.   VITAMIN D PO Take 2 capsules by mouth daily.        SignedLahoma Crocker A 08/23/2016, 10:46 AM

## 2016-08-23 NOTE — Progress Notes (Signed)
Assessment unchanged. Pt and mother verbalized understanding of dc instructions through teach back including follow up care and when to call the doctor. Script x 1 given to pt as provided by MD. Discharged via wc to front entrance accompanied by NT and mother.

## 2016-08-24 ENCOUNTER — Telehealth: Payer: Self-pay

## 2016-08-24 NOTE — Telephone Encounter (Signed)
Orders received from [facility] to contact the patient with her final pathology report. Results are "negative for cancer on final path". Patient contacted and updated with results, patient states understanding . Patient also states she is doing well post-operatively . Patient states she is sleeping and eating "good. Patient also states she is having regular BM with intermittent flatulence.Also states her pain is relieved with "ibuprofen" and she is not having any difficulty with sleeping. Patient post-op follow up appointment was scheduled for September 21, 2016 at 1:30 PM. Patient instructed to call with nay changes questions or concerns.

## 2016-09-21 ENCOUNTER — Ambulatory Visit: Payer: 59 | Attending: Gynecologic Oncology | Admitting: Gynecologic Oncology

## 2016-09-21 ENCOUNTER — Encounter: Payer: Self-pay | Admitting: Gynecologic Oncology

## 2016-09-21 VITALS — BP 131/67 | HR 71 | Temp 98.8°F | Resp 18 | Ht 70.0 in | Wt 247.9 lb

## 2016-09-21 DIAGNOSIS — D271 Benign neoplasm of left ovary: Secondary | ICD-10-CM | POA: Insufficient documentation

## 2016-09-21 DIAGNOSIS — N839 Noninflammatory disorder of ovary, fallopian tube and broad ligament, unspecified: Secondary | ICD-10-CM | POA: Diagnosis present

## 2016-09-21 DIAGNOSIS — Z90721 Acquired absence of ovaries, unilateral: Secondary | ICD-10-CM | POA: Insufficient documentation

## 2016-09-21 DIAGNOSIS — Z48816 Encounter for surgical aftercare following surgery on the genitourinary system: Secondary | ICD-10-CM | POA: Insufficient documentation

## 2016-09-21 DIAGNOSIS — N838 Other noninflammatory disorders of ovary, fallopian tube and broad ligament: Secondary | ICD-10-CM

## 2016-09-21 NOTE — Patient Instructions (Signed)
Follow up with your GYN for yearly exams.

## 2016-09-21 NOTE — Progress Notes (Signed)
POSTOPERATIVE VISIT  HPI:  Lindsay Turner is a 30 y.o. year old G56. initially seen in consultation on 08/12/16 for a large left ovarian mass referred by Dr Radene Knee.  She then underwent an ex lap, LSO and omentectomy on [id number] without complications.  Her postoperative course was uncomplicated.  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  She is seen today for a postoperative check and to discuss her pathology results and ongoing plan.  Since discharge from the hospital, she is feeling well.  She has improving appetite, normal bowel and bladder function, and pain controlled with minimal PO medication. She has no other complaints today.   Review of systems: Constitutional:  She has no weight gain or weight loss. She has no fever or chills. Eyes: No blurred vision Ears, Nose, Mouth, Throat: No dizziness, headaches or changes in hearing. No mouth sores. Cardiovascular: No chest pain, palpitations or edema. Respiratory:  No shortness of breath, wheezing or cough Gastrointestinal: She has normal bowel movements without diarrhea or constipation. She denies any nausea or vomiting. She denies blood in her stool or heart burn. Genitourinary:  She denies pelvic pain, pelvic pressure or changes in her urinary function. She has no hematuria, dysuria, or incontinence. She has no irregular vaginal bleeding or vaginal discharge Musculoskeletal: Denies muscle weakness or joint pains.  Skin:  She has no skin changes, rashes or itching Neurological:  Denies dizziness or headaches. No neuropathy, no numbness or tingling. Psychiatric:  She denies depression or anxiety. Hematologic/Lymphatic:   No easy bruising or bleeding   Physical Exam: There were no vitals taken for this visit. General: Well dressed, well nourished in no apparent distress.   HEENT:  Normocephalic and atraumatic, no lesions.  Extraocular muscles intact. Sclerae anicteric. Pupils equal, round, reactive. No mouth sores  or ulcers. Thyroid is normal size, not nodular, midline. Abdomen:  Soft, nontender, nondistended.  No palpable masses.  No hepatosplenomegaly.  No ascites. Normal bowel sounds.  No hernias.  Incision is well healed Genitourinary: deferred Extremities: No cyanosis, clubbing or edema.  No calf tenderness or erythema. No palpable cords. Psychiatric: Mood and affect are appropriate. Neurological: Awake, alert and oriented x 3. Sensation is intact, no neuropathy.  Musculoskeletal: No pain, normal strength and range of motion.  Assessment:    30 y.o. year old with a history of a mature teratoma of the left ovary.   S/p ex lap, LSO, omentectomy on 08/20/16.   Plan: 1) Pathology reports reviewed today 2) Treatment counseling - I discussed the benign nature of mature teratoma and recommend no interventions. I discussed there is a 10% risk for a second teratoma in the contralateral ovary. She was given the opportunity to ask questions, which were answered to her satisfaction, and she is agreement with the above mentioned plan of care.  3)  Return to clinic on a prn basis.  [name], MD

## 2016-12-15 ENCOUNTER — Encounter: Payer: Self-pay | Admitting: Gynecologic Oncology

## 2017-01-07 ENCOUNTER — Telehealth: Payer: Self-pay | Admitting: Neurology

## 2017-01-07 ENCOUNTER — Ambulatory Visit: Payer: 59 | Admitting: Neurology

## 2017-01-07 NOTE — Telephone Encounter (Signed)
This patient canceled same day of her appointment

## 2017-01-14 ENCOUNTER — Encounter: Payer: Self-pay | Admitting: Neurology

## 2017-08-17 IMAGING — CT CT ABD-PELV W/ CM
2 of 4 series · 16 of 46 positions shown, 18 images · IV contrast (APPLIED)
Comparison: None.

CLINICAL DATA: Left side abdominal pain for few days. Abdominal
distention for several months.

EXAM:
CT ABDOMEN AND PELVIS WITH CONTRAST
TECHNIQUE: Multidetector CT imaging of the abdomen and pelvis was performed
using the standard protocol following bolus administration of
intravenous contrast.
CONTRAST:  100 ml LU9BO3-MRR IOPAMIDOL (LU9BO3-MRR) INJECTION 61%

[Series 2: axial st · axial · 0.98mm/px · z∈[+62,+538]mm · 13 of 105 slices shown, 15 images]
[im 5/105  soft-tissue]
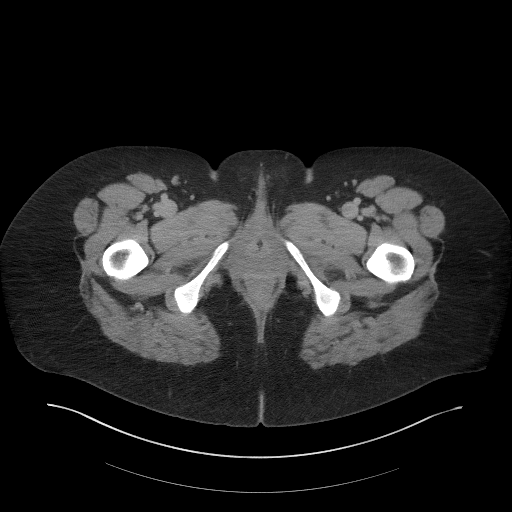
[im 5/105  bone]
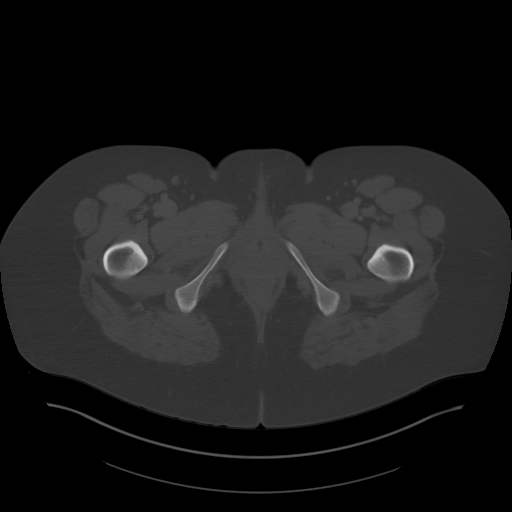
[im 14/105  soft-tissue]
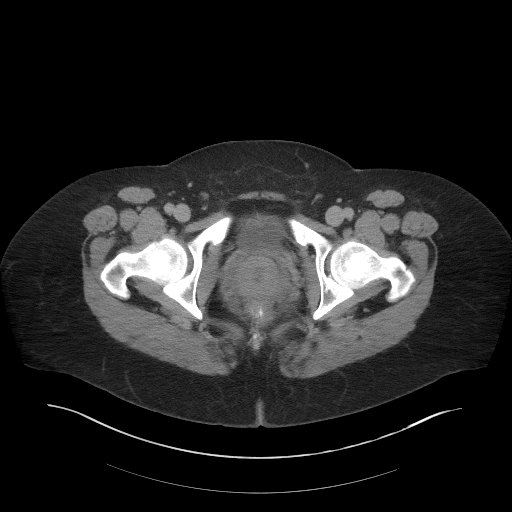
[im 23/105  soft-tissue]
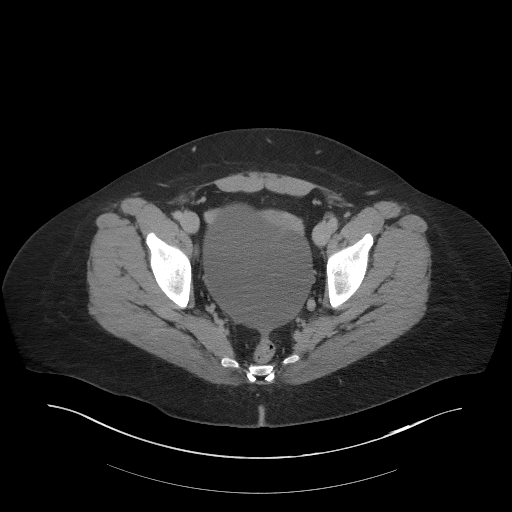
[im 28/105  soft-tissue]
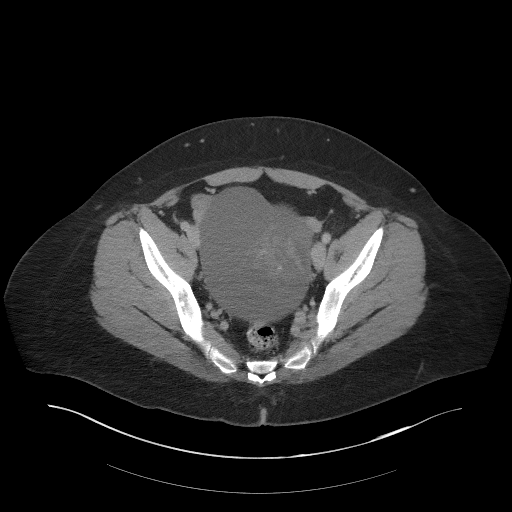
[im 37/105  soft-tissue]
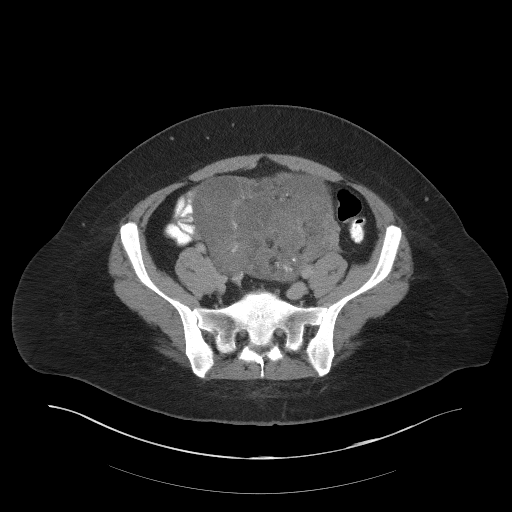
[im 46/105  soft-tissue]
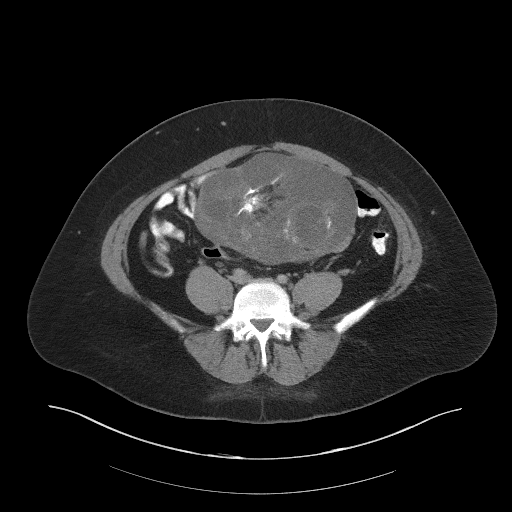
[im 55/105  soft-tissue]
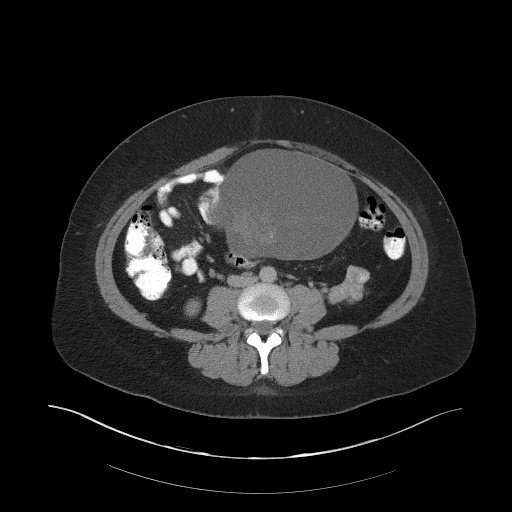
[im 59/105  soft-tissue]
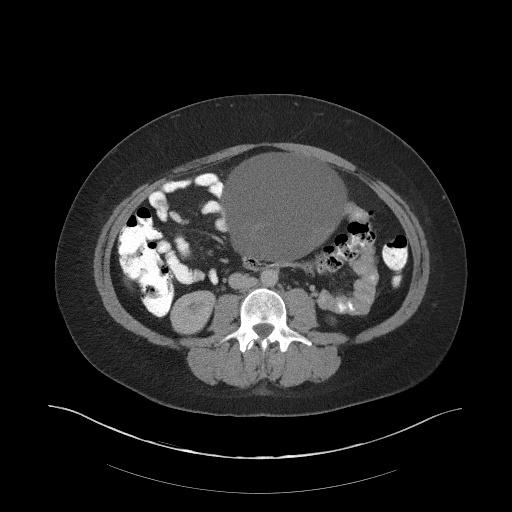
[im 68/105  soft-tissue]
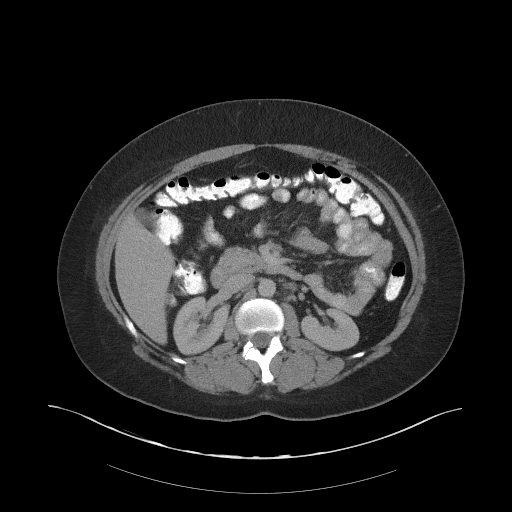
[im 68/105  bone]
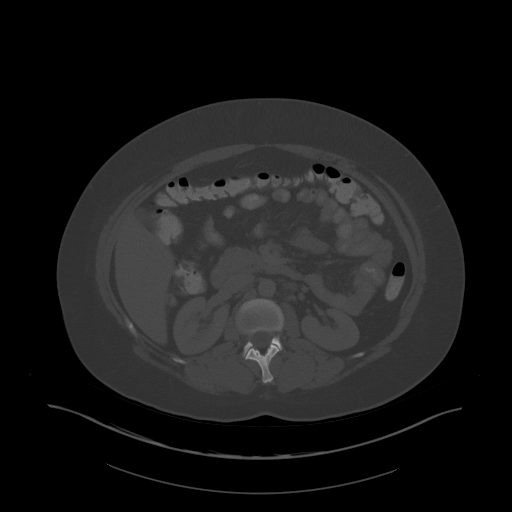
[im 77/105  soft-tissue]
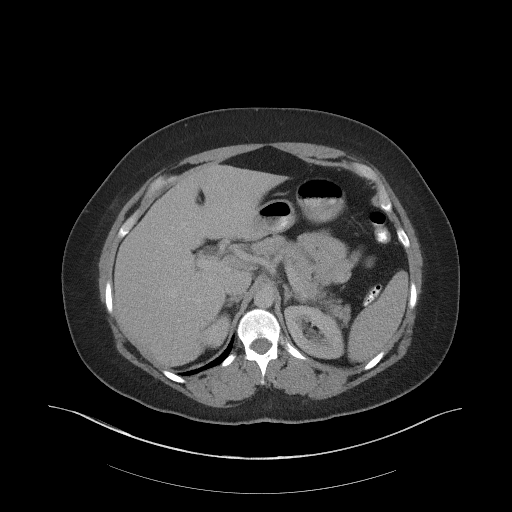
[im 82/105  soft-tissue]
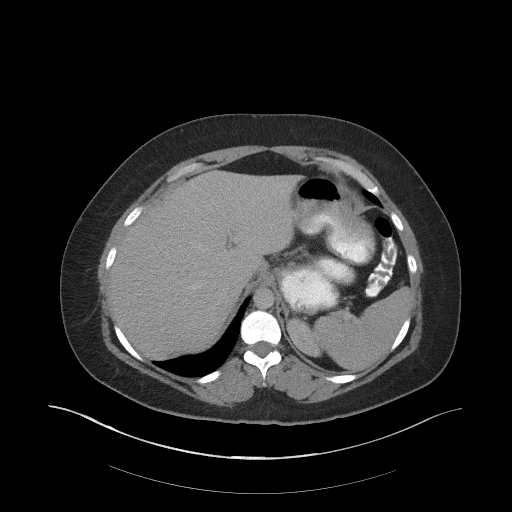
[im 91/105  soft-tissue]
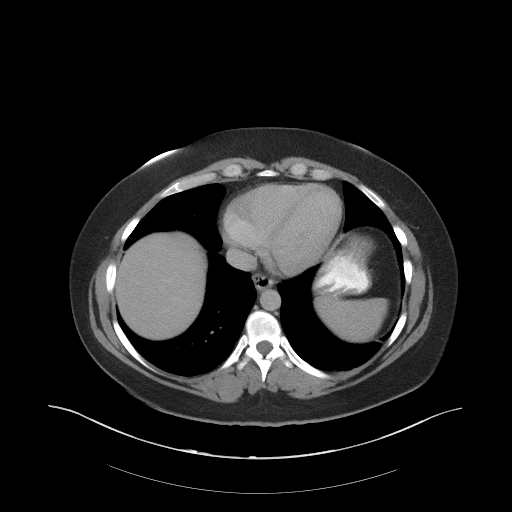
[im 100/105  soft-tissue]
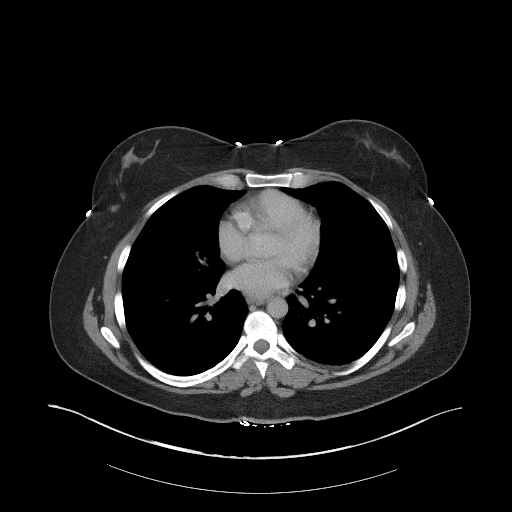

[Series 5: coronal st · coronal · 1.01mm/px · 3 of 99 slices shown]
[im 33/99  soft-tissue]
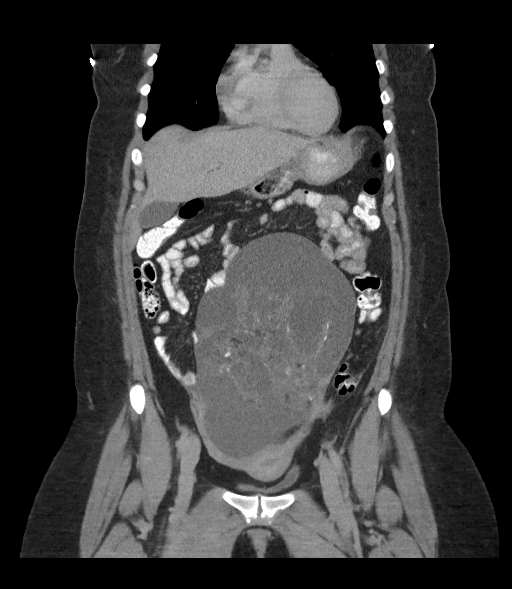
[im 44/99  soft-tissue]
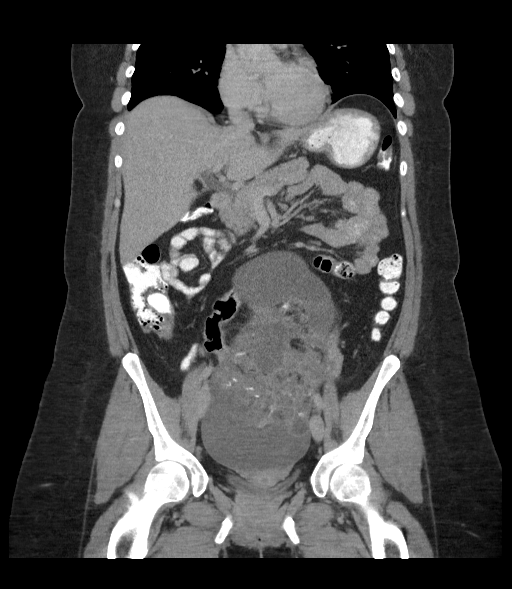
[im 55/99  soft-tissue]
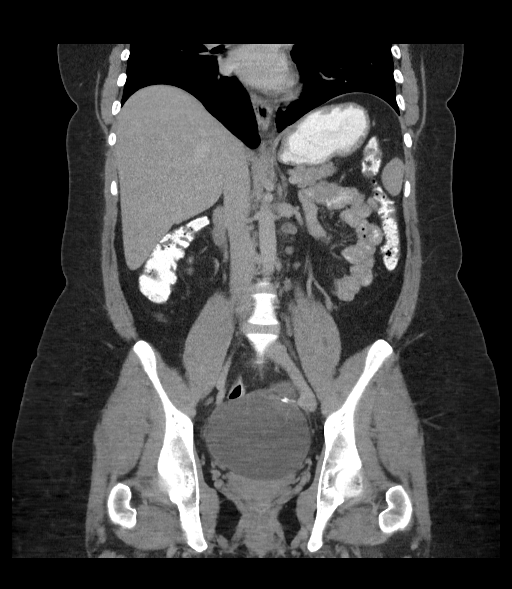

[16 of 46 positions shown; findings below may reference images not displayed]

FINDINGS: Lower chest: Clear.  No pleural or pericardial effusion.

Hepatobiliary: Unremarkable.

Pancreas: Appears normal.

Spleen: Appears normal.

Adrenals/Urinary Tract: Appear normal.

Stomach/Bowel: Appear normal.

Vascular/Lymphatic: Unremarkable.

Reproductive: There is a lesion which measures approximately 28 cm
craniocaudal by up to 14 cm transverse by 12 cm AP which is
predominantly cystic but has calcifications and areas of fat within
it. It is unclear from which ovary this lesion emanates. The uterus
appears normal. Both and ovaries abut this large lesion. The lesion
also has mass effect upon the sigmoid colon.

Other: None.

Musculoskeletal: No bony abnormality.
IMPRESSION: Mass lesion in the pelvis is consistent with a massive teratoma. It
is unclear from which ovary the lesion emanates. Doppler ultrasound
could be used to exclude torsion as the patient has pain. Referral
to gynecologic oncology is recommended. The examination is otherwise
negative.

## 2017-08-17 IMAGING — DX DG ABDOMEN 1V
2 series · 2 of 2 positions shown · non-contrast
Comparison: None.

CLINICAL DATA: Left side abdominal distention. Loose stools.
Symptoms today.

EXAM:
ABDOMEN - 1 VIEW

[abdomen kub (1 of 2)]
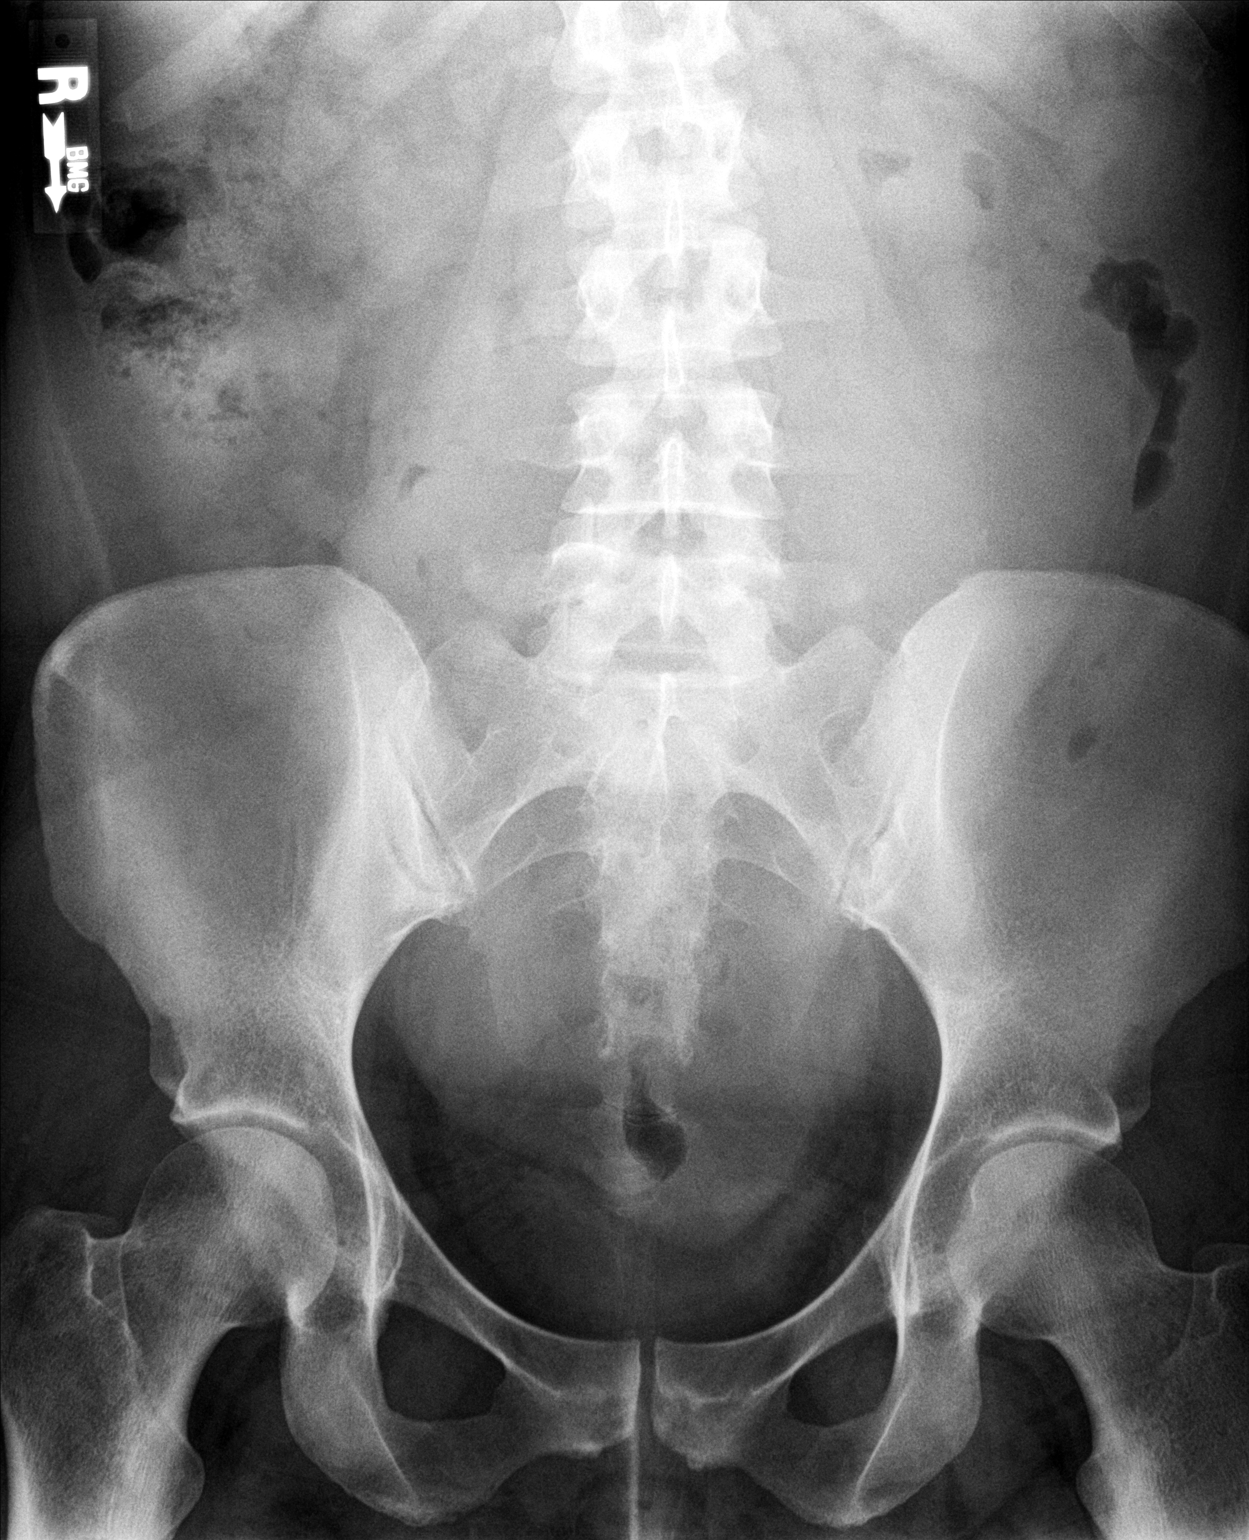

[abdomen kub (2 of 2)]
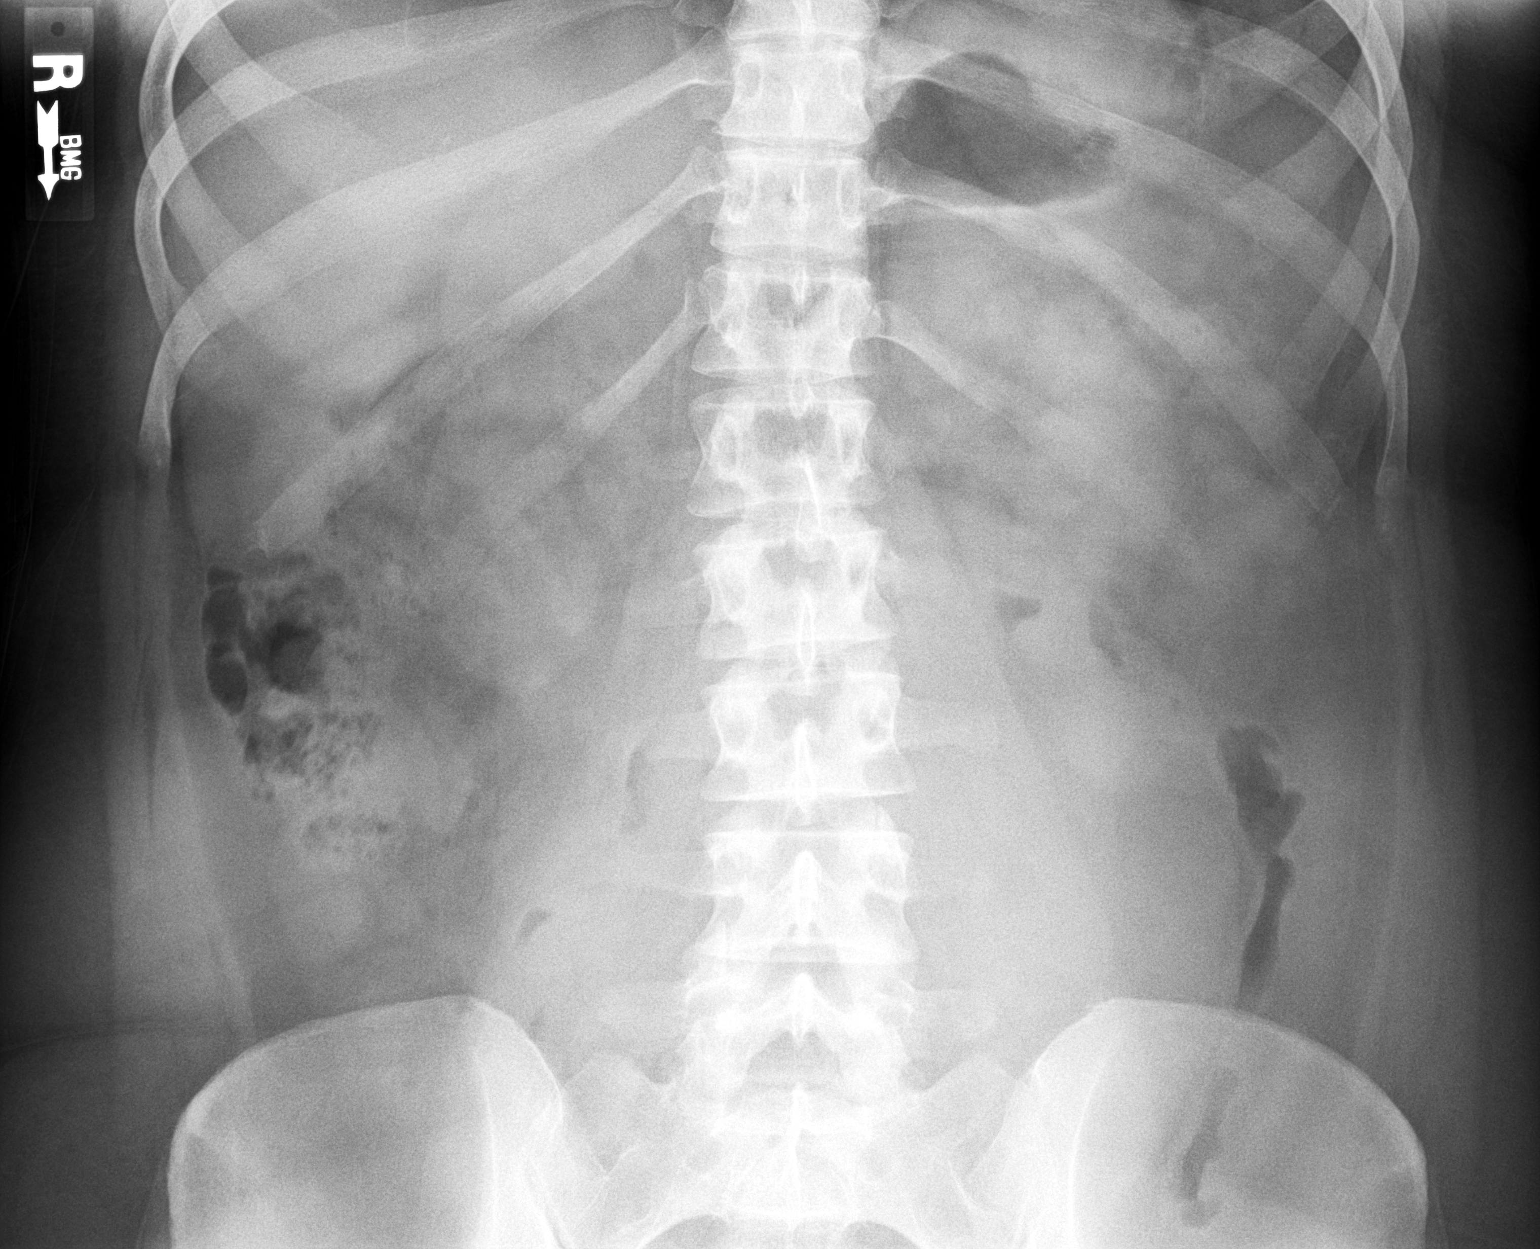

[2 of 2 positions shown; findings below may reference images not displayed]

FINDINGS: The bowel gas pattern is normal. No radio-opaque calculi or other
significant radiographic abnormality are seen.
IMPRESSION: Negative exam.

## 2019-05-30 LAB — OB RESULTS CONSOLE GC/CHLAMYDIA
Chlamydia: NEGATIVE
Gonorrhea: NEGATIVE

## 2019-05-30 LAB — OB RESULTS CONSOLE ANTIBODY SCREEN: Antibody Screen: NEGATIVE

## 2019-05-30 LAB — OB RESULTS CONSOLE ABO/RH: RH Type: POSITIVE

## 2019-05-30 LAB — OB RESULTS CONSOLE HEPATITIS B SURFACE ANTIGEN: Hepatitis B Surface Ag: NEGATIVE

## 2019-05-30 LAB — OB RESULTS CONSOLE RPR: RPR: NONREACTIVE

## 2019-05-30 LAB — OB RESULTS CONSOLE HIV ANTIBODY (ROUTINE TESTING): HIV: NONREACTIVE

## 2019-05-30 LAB — OB RESULTS CONSOLE RUBELLA ANTIBODY, IGM: Rubella: IMMUNE

## 2019-11-06 LAB — OB RESULTS CONSOLE GBS: GBS: NEGATIVE

## 2019-11-21 ENCOUNTER — Encounter (HOSPITAL_COMMUNITY): Payer: Self-pay | Admitting: *Deleted

## 2019-11-21 ENCOUNTER — Encounter (HOSPITAL_COMMUNITY): Payer: Self-pay

## 2019-11-21 NOTE — Patient Instructions (Signed)
MURLINE BERRYMAN  11/21/2019   Your procedure is scheduled on:  12/02/2019  Arrive at 46 at Entrance C on [address] at [facility]  and [employer]. You are invited to use the FREE valet parking or use the Visitor's parking deck.  Pick up the phone at the desk and dial [phone].  Call this number if you have problems the morning of surgery: [phone]  Remember:   Do not eat food:(After Midnight) Desps de medianoche.  Do not drink clear liquids: (After Midnight) Desps de medianoche.  Take these medicines the morning of surgery with A SIP OF WATER:  prozac   Do not wear jewelry, make-up or nail polish.  Do not wear lotions, powders, or perfumes. Do not wear deodorant.  Do not shave 48 hours prior to surgery.  Do not bring valuables to the hospital.  [facility] is not   responsible for any belongings or valuables brought to the hospital.  Contacts, dentures or bridgework may not be worn into surgery.  Leave suitcase in the car. After surgery it may be brought to your room.  For patients admitted to the hospital, checkout time is 11:00 AM the day of              discharge.      Please read over the following fact sheets that you were given:     Preparing for Surgery

## 2019-11-22 ENCOUNTER — Other Ambulatory Visit: Payer: Self-pay | Admitting: Obstetrics

## 2019-11-30 ENCOUNTER — Other Ambulatory Visit: Payer: Self-pay

## 2019-11-30 ENCOUNTER — Other Ambulatory Visit (HOSPITAL_COMMUNITY)
Admission: RE | Admit: 2019-11-30 | Discharge: 2019-11-30 | Disposition: A | Discharge status: Home / Self Care | Payer: Federal, State, Local not specified - PPO | Source: Ambulatory Visit | Attending: Obstetrics | Admitting: Obstetrics

## 2019-11-30 DIAGNOSIS — Z01812 Encounter for preprocedural laboratory examination: Secondary | ICD-10-CM | POA: Diagnosis present

## 2019-11-30 DIAGNOSIS — Z20822 Contact with and (suspected) exposure to covid-19: Secondary | ICD-10-CM | POA: Insufficient documentation

## 2019-11-30 HISTORY — DX: Neoplasm of uncertain behavior, unspecified: D48.9

## 2019-11-30 LAB — TYPE AND SCREEN
ABO/RH(D): O POS
Antibody Screen: NEGATIVE

## 2019-11-30 LAB — CBC
HCT: 38.1 % (ref 36.0–46.0)
Hemoglobin: 12 g/dL (ref 12.0–15.0)
MCH: 26.6 pg (ref 26.0–34.0)
MCHC: 31.5 g/dL (ref 30.0–36.0)
MCV: 84.5 fL (ref 80.0–100.0)
Platelets: 309 10*3/uL (ref 150–400)
RBC: 4.51 MIL/uL (ref 3.87–5.11)
RDW: 15.6 % — ABNORMAL HIGH (ref 11.5–15.5)
WBC: 10.7 10*3/uL — ABNORMAL HIGH (ref 4.0–10.5)
nRBC: 0 % (ref 0.0–0.2)

## 2019-11-30 LAB — ABO/RH: ABO/RH(D): O POS

## 2019-11-30 LAB — RPR: RPR Ser Ql: NONREACTIVE

## 2019-11-30 LAB — SARS CORONAVIRUS 2 (TAT 6-24 HRS): SARS Coronavirus 2: NEGATIVE

## 2019-11-30 NOTE — MAU Note (Signed)
Covid swab collected.the patient tolerated well. Asymptomatic, Pt waiting for CBC/RPR/Type ans screen to be drawn

## 2019-12-02 ENCOUNTER — Inpatient Hospital Stay (HOSPITAL_COMMUNITY)
Admission: RE | Admit: 2019-12-02 | Discharge: 2019-12-05 | DRG: 787 | Disposition: A | Discharge status: Home / Self Care | Payer: Federal, State, Local not specified - PPO | Attending: Obstetrics | Admitting: Obstetrics

## 2019-12-02 ENCOUNTER — Encounter (HOSPITAL_COMMUNITY)
Admission: RE | Disposition: A | Discharge status: Home / Self Care | Payer: Self-pay | Source: Home / Self Care | Attending: Obstetrics

## 2019-12-02 ENCOUNTER — Inpatient Hospital Stay (HOSPITAL_COMMUNITY): Payer: Federal, State, Local not specified - PPO | Admitting: Anesthesiology

## 2019-12-02 ENCOUNTER — Other Ambulatory Visit: Payer: Self-pay

## 2019-12-02 ENCOUNTER — Encounter (HOSPITAL_COMMUNITY): Payer: Self-pay | Admitting: Obstetrics

## 2019-12-02 DIAGNOSIS — E669 Obesity, unspecified: Secondary | ICD-10-CM | POA: Diagnosis present

## 2019-12-02 DIAGNOSIS — O99344 Other mental disorders complicating childbirth: Secondary | ICD-10-CM | POA: Diagnosis present

## 2019-12-02 DIAGNOSIS — O321XX0 Maternal care for breech presentation, not applicable or unspecified: Secondary | ICD-10-CM | POA: Diagnosis present

## 2019-12-02 DIAGNOSIS — O9081 Anemia of the puerperium: Secondary | ICD-10-CM | POA: Diagnosis not present

## 2019-12-02 DIAGNOSIS — Z98891 History of uterine scar from previous surgery: Secondary | ICD-10-CM

## 2019-12-02 DIAGNOSIS — Z3A39 39 weeks gestation of pregnancy: Secondary | ICD-10-CM

## 2019-12-02 DIAGNOSIS — O3663X0 Maternal care for excessive fetal growth, third trimester, not applicable or unspecified: Secondary | ICD-10-CM | POA: Diagnosis present

## 2019-12-02 DIAGNOSIS — F419 Anxiety disorder, unspecified: Secondary | ICD-10-CM | POA: Diagnosis present

## 2019-12-02 DIAGNOSIS — D62 Acute posthemorrhagic anemia: Secondary | ICD-10-CM | POA: Diagnosis not present

## 2019-12-02 DIAGNOSIS — O9902 Anemia complicating childbirth: Secondary | ICD-10-CM | POA: Diagnosis not present

## 2019-12-02 DIAGNOSIS — O99214 Obesity complicating childbirth: Secondary | ICD-10-CM | POA: Diagnosis present

## 2019-12-02 SURGERY — Surgical Case
Anesthesia: Spinal | Site: Abdomen | Wound class: Clean Contaminated

## 2019-12-02 MED ORDER — PHENYLEPHRINE HCL-NACL 10-0.9 MG/250ML-% IV SOLN
INTRAVENOUS | Status: DC | PRN
  Administered 2019-12-02: 40 ug/min via INTRAVENOUS

## 2019-12-02 MED ORDER — KETOROLAC TROMETHAMINE 30 MG/ML IJ SOLN: 30.0000 mg | Freq: Four times a day (QID) | INTRAMUSCULAR | Status: AC | PRN

## 2019-12-02 MED ORDER — SODIUM CHLORIDE 0.9% FLUSH: 3.0000 mL | INTRAVENOUS | Status: DC | PRN

## 2019-12-02 MED ORDER — LACTATED RINGERS IV SOLN: INTRAVENOUS | Status: DC

## 2019-12-02 MED ORDER — MENTHOL 3 MG MT LOZG: 1.0000 | LOZENGE | OROMUCOSAL | Status: DC | PRN

## 2019-12-02 MED ORDER — DIPHENHYDRAMINE HCL 25 MG PO CAPS: 25.0000 mg | ORAL_CAPSULE | Freq: Four times a day (QID) | ORAL | Status: DC | PRN

## 2019-12-02 MED ORDER — MORPHINE SULFATE (PF) 0.5 MG/ML IJ SOLN
INTRAMUSCULAR | Status: AC
  Filled 2019-12-02: qty 10

## 2019-12-02 MED ORDER — SODIUM CHLORIDE 0.9 % IR SOLN
Status: DC | PRN
  Administered 2019-12-02: 1000 mL

## 2019-12-02 MED ORDER — KETOROLAC TROMETHAMINE 30 MG/ML IJ SOLN
30.0000 mg | Freq: Once | INTRAMUSCULAR | Status: AC
  Administered 2019-12-02: 13:00:00 30 mg via INTRAVENOUS

## 2019-12-02 MED ORDER — FENTANYL CITRATE (PF) 100 MCG/2ML IJ SOLN
INTRAMUSCULAR | Status: DC | PRN
  Administered 2019-12-02: 15 ug via INTRATHECAL

## 2019-12-02 MED ORDER — NALBUPHINE HCL 10 MG/ML IJ SOLN: 5.0000 mg | INTRAMUSCULAR | Status: DC | PRN

## 2019-12-02 MED ORDER — PHENYLEPHRINE HCL-NACL 20-0.9 MG/250ML-% IV SOLN
INTRAVENOUS | Status: AC
  Filled 2019-12-02: qty 250

## 2019-12-02 MED ORDER — PROMETHAZINE HCL 25 MG/ML IJ SOLN: 6.2500 mg | INTRAMUSCULAR | Status: DC | PRN

## 2019-12-02 MED ORDER — PRENATAL MULTIVITAMIN CH
1.0000 | ORAL_TABLET | Freq: Every day | ORAL | Status: DC
  Administered 2019-12-03 – 2019-12-05 (×3): 1 via ORAL
  Filled 2019-12-02 (×3): qty 1

## 2019-12-02 MED ORDER — STERILE WATER FOR IRRIGATION IR SOLN
Status: DC | PRN
  Administered 2019-12-02: 1000 mL

## 2019-12-02 MED ORDER — DEXTROSE 5 % IV SOLN
3.0000 g | INTRAVENOUS | Status: AC
  Administered 2019-12-02: 3 g via INTRAVENOUS

## 2019-12-02 MED ORDER — ENOXAPARIN SODIUM 60 MG/0.6ML ~~LOC~~ SOLN
60.0000 mg | SUBCUTANEOUS | Status: DC
  Administered 2019-12-03 – 2019-12-04 (×2): 60 mg via SUBCUTANEOUS
  Filled 2019-12-02 (×2): qty 0.6

## 2019-12-02 MED ORDER — DEXAMETHASONE SODIUM PHOSPHATE 10 MG/ML IJ SOLN
INTRAMUSCULAR | Status: AC
  Filled 2019-12-02: qty 1

## 2019-12-02 MED ORDER — NALBUPHINE HCL 10 MG/ML IJ SOLN: 5.0000 mg | Freq: Once | INTRAMUSCULAR | Status: DC | PRN

## 2019-12-02 MED ORDER — OXYTOCIN 40 UNITS IN NORMAL SALINE INFUSION - SIMPLE MED
INTRAVENOUS | Status: AC
  Filled 2019-12-02: qty 1000

## 2019-12-02 MED ORDER — BUPIVACAINE IN DEXTROSE 0.75-8.25 % IT SOLN
INTRATHECAL | Status: DC | PRN
  Administered 2019-12-02: 1.6 mL via INTRATHECAL

## 2019-12-02 MED ORDER — WITCH HAZEL-GLYCERIN EX PADS: 1.0000 "application " | MEDICATED_PAD | CUTANEOUS | Status: DC | PRN

## 2019-12-02 MED ORDER — SIMETHICONE 80 MG PO CHEW
80.0000 mg | CHEWABLE_TABLET | ORAL | Status: DC
  Administered 2019-12-02 – 2019-12-04 (×3): 80 mg via ORAL
  Filled 2019-12-02 (×3): qty 1

## 2019-12-02 MED ORDER — FLUOXETINE HCL 10 MG PO CAPS
10.0000 mg | ORAL_CAPSULE | Freq: Every day | ORAL | Status: DC
  Administered 2019-12-03 – 2019-12-05 (×3): 10 mg via ORAL
  Filled 2019-12-02 (×3): qty 1

## 2019-12-02 MED ORDER — MORPHINE SULFATE (PF) 0.5 MG/ML IJ SOLN
INTRAMUSCULAR | Status: DC | PRN
  Administered 2019-12-02: 150 ug via INTRATHECAL

## 2019-12-02 MED ORDER — ZOLPIDEM TARTRATE 5 MG PO TABS: 5.0000 mg | ORAL_TABLET | Freq: Every evening | ORAL | Status: DC | PRN

## 2019-12-02 MED ORDER — SENNOSIDES-DOCUSATE SODIUM 8.6-50 MG PO TABS
2.0000 | ORAL_TABLET | ORAL | Status: DC
  Administered 2019-12-02 – 2019-12-04 (×3): 2 via ORAL
  Filled 2019-12-02 (×3): qty 2

## 2019-12-02 MED ORDER — ACETAMINOPHEN 500 MG PO TABS
1000.0000 mg | ORAL_TABLET | Freq: Four times a day (QID) | ORAL | Status: AC
  Administered 2019-12-02 – 2019-12-03 (×4): 1000 mg via ORAL
  Filled 2019-12-02 (×4): qty 2

## 2019-12-02 MED ORDER — KETOROLAC TROMETHAMINE 30 MG/ML IJ SOLN
INTRAMUSCULAR | Status: AC
  Filled 2019-12-02: qty 1

## 2019-12-02 MED ORDER — PHENYLEPHRINE 40 MCG/ML (10ML) SYRINGE FOR IV PUSH (FOR BLOOD PRESSURE SUPPORT)
PREFILLED_SYRINGE | INTRAVENOUS | Status: AC
  Filled 2019-12-02: qty 10

## 2019-12-02 MED ORDER — COCONUT OIL OIL: 1.0000 "application " | TOPICAL_OIL | Status: DC | PRN

## 2019-12-02 MED ORDER — DIPHENHYDRAMINE HCL 50 MG/ML IJ SOLN: 12.5000 mg | INTRAMUSCULAR | Status: DC | PRN

## 2019-12-02 MED ORDER — DEXAMETHASONE SODIUM PHOSPHATE 10 MG/ML IJ SOLN
INTRAMUSCULAR | Status: DC | PRN
  Administered 2019-12-02: 4 mg via INTRAVENOUS

## 2019-12-02 MED ORDER — ONDANSETRON HCL 4 MG/2ML IJ SOLN
INTRAMUSCULAR | Status: AC
  Filled 2019-12-02: qty 2

## 2019-12-02 MED ORDER — ACETAMINOPHEN 160 MG/5ML PO SOLN: 1000.0000 mg | Freq: Once | ORAL | Status: DC

## 2019-12-02 MED ORDER — NALOXONE HCL 4 MG/10ML IJ SOLN
1.0000 ug/kg/h | INTRAVENOUS | Status: DC | PRN
  Filled 2019-12-02: qty 5

## 2019-12-02 MED ORDER — OXYTOCIN 40 UNITS IN NORMAL SALINE INFUSION - SIMPLE MED
INTRAVENOUS | Status: DC | PRN
  Administered 2019-12-02: 600 mL via INTRAVENOUS

## 2019-12-02 MED ORDER — SIMETHICONE 80 MG PO CHEW: 80.0000 mg | CHEWABLE_TABLET | ORAL | Status: DC | PRN

## 2019-12-02 MED ORDER — FENTANYL CITRATE (PF) 100 MCG/2ML IJ SOLN: 25.0000 ug | INTRAMUSCULAR | Status: DC | PRN

## 2019-12-02 MED ORDER — SIMETHICONE 80 MG PO CHEW
80.0000 mg | CHEWABLE_TABLET | Freq: Three times a day (TID) | ORAL | Status: DC
  Administered 2019-12-03 – 2019-12-05 (×5): 80 mg via ORAL
  Filled 2019-12-02 (×5): qty 1

## 2019-12-02 MED ORDER — CEFAZOLIN SODIUM-DEXTROSE 2-4 GM/100ML-% IV SOLN
INTRAVENOUS | Status: AC
  Filled 2019-12-02: qty 100

## 2019-12-02 MED ORDER — NALOXONE HCL 0.4 MG/ML IJ SOLN: 0.4000 mg | INTRAMUSCULAR | Status: DC | PRN

## 2019-12-02 MED ORDER — KETOROLAC TROMETHAMINE 30 MG/ML IJ SOLN
30.0000 mg | Freq: Four times a day (QID) | INTRAMUSCULAR | Status: AC | PRN
  Administered 2019-12-02 – 2019-12-03 (×2): 30 mg via INTRAVENOUS
  Filled 2019-12-02 (×2): qty 1

## 2019-12-02 MED ORDER — ONDANSETRON HCL 4 MG/2ML IJ SOLN
INTRAMUSCULAR | Status: DC | PRN
  Administered 2019-12-02: 4 mg via INTRAVENOUS

## 2019-12-02 MED ORDER — TETANUS-DIPHTH-ACELL PERTUSSIS 5-2.5-18.5 LF-MCG/0.5 IM SUSP: 0.5000 mL | Freq: Once | INTRAMUSCULAR | Status: DC

## 2019-12-02 MED ORDER — DIBUCAINE (PERIANAL) 1 % EX OINT: 1.0000 "application " | TOPICAL_OINTMENT | CUTANEOUS | Status: DC | PRN

## 2019-12-02 MED ORDER — ONDANSETRON HCL 4 MG/2ML IJ SOLN: 4.0000 mg | Freq: Three times a day (TID) | INTRAMUSCULAR | Status: DC | PRN

## 2019-12-02 MED ORDER — DIPHENHYDRAMINE HCL 25 MG PO CAPS: 25.0000 mg | ORAL_CAPSULE | ORAL | Status: DC | PRN

## 2019-12-02 MED ORDER — EPHEDRINE SULFATE 50 MG/ML IJ SOLN
INTRAMUSCULAR | Status: AC
  Filled 2019-12-02: qty 1

## 2019-12-02 MED ORDER — OXYTOCIN 40 UNITS IN NORMAL SALINE INFUSION - SIMPLE MED: 2.5000 [IU]/h | INTRAVENOUS | Status: AC

## 2019-12-02 MED ORDER — FENTANYL CITRATE (PF) 100 MCG/2ML IJ SOLN
INTRAMUSCULAR | Status: AC
  Filled 2019-12-02: qty 2

## 2019-12-02 MED ORDER — ACETAMINOPHEN 500 MG PO TABS: 1000.0000 mg | ORAL_TABLET | Freq: Once | ORAL | Status: DC

## 2019-12-02 SURGICAL SUPPLY — 34 items
APL SKNCLS STERI-STRIP NONHPOA (GAUZE/BANDAGES/DRESSINGS) ×1
BENZOIN TINCTURE PRP APPL 2/3 (GAUZE/BANDAGES/DRESSINGS) ×1 IMPLANT
CHLORAPREP W/TINT 26ML (MISCELLANEOUS) ×2 IMPLANT
CLAMP CORD UMBIL (MISCELLANEOUS) IMPLANT
CLOTH BEACON ORANGE TIMEOUT ST (SAFETY) ×2 IMPLANT
DRSG OPSITE POSTOP 4X10 (GAUZE/BANDAGES/DRESSINGS) ×2 IMPLANT
ELECT REM PT RETURN 9FT ADLT (ELECTROSURGICAL) ×2
ELECTRODE REM PT RTRN 9FT ADLT (ELECTROSURGICAL) ×1 IMPLANT
EXTRACTOR VACUUM M CUP 4 TUBE (SUCTIONS) IMPLANT
GLOVE BIO SURGEON STRL SZ 6.5 (GLOVE) ×2 IMPLANT
GLOVE BIOGEL PI IND STRL 7.0 (GLOVE) ×2 IMPLANT
GLOVE BIOGEL PI INDICATOR 7.0 (GLOVE) ×2
GOWN STRL REUS W/TWL LRG LVL3 (GOWN DISPOSABLE) ×4 IMPLANT
KIT ABG SYR 3ML LUER SLIP (SYRINGE) IMPLANT
NDL HYPO 25X5/8 SAFETYGLIDE (NEEDLE) IMPLANT
NEEDLE HYPO 22GX1.5 SAFETY (NEEDLE) IMPLANT
NEEDLE HYPO 25X5/8 SAFETYGLIDE (NEEDLE) IMPLANT
NS IRRIG 1000ML POUR BTL (IV SOLUTION) ×2 IMPLANT
PACK C SECTION WH (CUSTOM PROCEDURE TRAY) ×2 IMPLANT
PAD OB MATERNITY 4.3X12.25 (PERSONAL CARE ITEMS) ×2 IMPLANT
PENCIL SMOKE EVAC W/HOLSTER (ELECTROSURGICAL) ×2 IMPLANT
STRIP CLOSURE SKIN 1/2X4 (GAUZE/BANDAGES/DRESSINGS) ×1 IMPLANT
SUT MON AB 4-0 PS1 27 (SUTURE) ×2 IMPLANT
SUT PLAIN 0 NONE (SUTURE) IMPLANT
SUT PLAIN 2 0 XLH (SUTURE) IMPLANT
SUT VIC AB 0 CT1 36 (SUTURE) ×4 IMPLANT
SUT VIC AB 0 CTX 36 (SUTURE) ×4
SUT VIC AB 0 CTX36XBRD ANBCTRL (SUTURE) ×2 IMPLANT
SUT VIC AB 2-0 CT1 27 (SUTURE) ×2
SUT VIC AB 2-0 CT1 TAPERPNT 27 (SUTURE) ×1 IMPLANT
SYR CONTROL 10ML LL (SYRINGE) IMPLANT
TOWEL OR 17X24 6PK STRL BLUE (TOWEL DISPOSABLE) ×2 IMPLANT
TRAY FOLEY W/BAG SLVR 14FR LF (SET/KITS/TRAYS/PACK) IMPLANT
WATER STERILE IRR 1000ML POUR (IV SOLUTION) ×2 IMPLANT

## 2019-12-02 NOTE — Transfer of Care (Signed)
Immediate Anesthesia Transfer of Care Note  Patient: Lindsay Turner  Procedure(s) Performed: CESAREAN SECTION (N/A Abdomen)  Patient Location: PACU  Anesthesia Type:Spinal  Level of Consciousness: awake, alert , oriented and patient cooperative  Airway & Oxygen Therapy: Patient Spontanous Breathing  Post-op Assessment: Report given to RN and Post -op Vital signs reviewed and stable  Post vital signs: Reviewed and stable  Last Vitals:  Vitals Value Taken Time  BP    Temp    Pulse    Resp    SpO2      Last Pain:  Vitals:   12/02/19 0946  TempSrc: Oral         Complications: No apparent anesthesia complications

## 2019-12-02 NOTE — Anesthesia Preprocedure Evaluation (Addendum)
Anesthesia Evaluation  Patient identified by MRN, date of birth, ID band Patient awake    Reviewed: Allergy & Precautions, NPO status , Patient's Chart, lab work & pertinent test results  History of Anesthesia Complications Negative for: history of anesthetic complications  Airway Mallampati: II  TM Distance: >3 FB Neck ROM: Full    Dental  (+) Missing,    Pulmonary neg pulmonary ROS,    Pulmonary exam normal        Cardiovascular negative cardio ROS Normal cardiovascular exam     Neuro/Psych negative neurological ROS     GI/Hepatic Neg liver ROS, GERD  ,  Endo/Other  negative endocrine ROS  Renal/GU negative Renal ROS  negative genitourinary   Musculoskeletal negative musculoskeletal ROS (+)   Abdominal   Peds  Hematology negative hematology ROS (+)   Anesthesia Other Findings Day of surgery medications reviewed with patient.  Reproductive/Obstetrics (+) Pregnancy (breech presentation)                            Anesthesia Physical Anesthesia Plan  ASA: II  Anesthesia Plan: Spinal   Post-op Pain Management:    Induction:   PONV Risk Score and Plan: 4 or greater and Treatment may vary due to age or medical condition, Ondansetron and Dexamethasone  Airway Management Planned: Natural Airway  Additional Equipment: None  Intra-op Plan:   Post-operative Plan:   Informed Consent: I have reviewed the patients History and Physical, chart, labs and discussed the procedure including the risks, benefits and alternatives for the proposed anesthesia with the patient or authorized representative who has indicated his/her understanding and acceptance.       Plan Discussed with:   Anesthesia Plan Comments:        Anesthesia Quick Evaluation

## 2019-12-02 NOTE — Progress Notes (Signed)
MOB was referred for history of depression/anxiety. * Referral screened out by Clinical Social Worker because none of the following criteria appear to apply: ~ History of anxiety/depression during this pregnancy, or of post-partum depression following prior delivery. ~ Diagnosis of anxiety and/or depression within last 3 years OR * MOB's symptoms currently being treated with medication and/or therapy. Per further chart review, MOB on Prozac for depression/anxiety.   Please contact the Clinical Social Worker if needs arise, by [facility] request, or if MOB scores greater than 9/yes to question 10 on Edinburgh Postpartum Depression Screen.     Virgie Dad Kenzli Barritt, MSW, LCSW [facility] [place] [facility] [place] [phone]

## 2019-12-02 NOTE — Brief Op Note (Signed)
12/02/2019  12:04 PM  PATIENT:  Lindsay Turner  34 y.o. female  PRE-OPERATIVE DIAGNOSIS:  breech presentation  POST-OPERATIVE DIAGNOSIS:  breech presentation  PROCEDURE:  Procedure(s): CESAREAN SECTION (N/A)  LTCS, 2 layer cloosure  SURGEON:  Surgeon(s) and Role:    * Aloha Gell, MD - Primary  PHYSICIAN ASSISTANT: none  ASSISTANTS: Haskel Khan, CNM   ANESTHESIA:   spinal  EBL:  326 mL   BLOOD ADMINISTERED:none  DRAINS: Urinary Catheter (Foley)   LOCAL MEDICATIONS USED:  NONE  SPECIMEN:  Source of Specimen:  placenta  DISPOSITION OF SPECIMEN:  LDR for disporal  COUNTS:  YES  TOURNIQUET:  * No tourniquets in log *  DICTATION: .Note written in EPIC  PLAN OF CARE: Admit to inpatient   PATIENT DISPOSITION:  PACU - hemodynamically stable.   Delay start of Pharmacological VTE agent (>24hrs) due to surgical blood loss or risk of bleeding: yes

## 2019-12-02 NOTE — Anesthesia Postprocedure Evaluation (Signed)
Anesthesia Post Note  Patient: Lindsay Turner  Procedure(s) Performed: CESAREAN SECTION (N/A Abdomen)     Patient location during evaluation: PACU Anesthesia Type: Spinal Level of consciousness: awake and alert and oriented Pain management: pain level controlled Vital Signs Assessment: post-procedure vital signs reviewed and stable Respiratory status: spontaneous breathing, nonlabored ventilation and respiratory function stable Cardiovascular status: blood pressure returned to baseline Postop Assessment: no apparent nausea or vomiting, spinal receding, no headache and no backache Anesthetic complications: no    Last Vitals:  Vitals:   12/02/19 1300 12/02/19 1315  BP: 103/69 110/72  Pulse: 60 (!) 59  Resp: 19 14  Temp:    SpO2: 100% 100%    Last Pain:  Vitals:   12/02/19 1218  TempSrc: Oral   Pain Goal:    LLE Motor Response: Purposeful movement (12/02/19 1246) LLE Sensation: Tingling (12/02/19 1246) RLE Motor Response: Purposeful movement (12/02/19 1246) RLE Sensation: Tingling (12/02/19 1246)     Epidural/Spinal Function Cutaneous sensation: Pins and Needles (12/02/19 1246), Patient able to flex knees: No (12/02/19 1246), Patient able to lift hips off bed: No (12/02/19 1246), Back pain beyond tenderness at insertion site: No (12/02/19 1246), Progressively worsening motor and/or sensory loss: No (12/02/19 1246), Bowel and/or bladder incontinence post epidural: No (12/02/19 1246)  Brennan Bailey

## 2019-12-02 NOTE — H&P (Signed)
Lindsay Turner is a 34 y.o. G1P0 at [redacted]w[redacted]d presenting for primary cesarean section due to breech presentation. Pt notes rare contractions . Good fetal movement, No vaginal bleeding, not leaking fluid .  PNCare at [facility] since 34 wks -Late transfer prenatal care due to move.  Patient with adequate prenatal care prior to transfer Breech presentation.  Patient declined version and opts for primary cesarean section Fetal macrosomia.  No gestational diabetes.  Ultrasound at 39 weeks shows baby 9 pounds 4 ounces at the 99th percentile with an AC at the 99th percentile Anxiety.  On fluoxetine History of large vertical midline incision for left salpingo-oophorectomy for benign pathology.  Had been followed by oncology. -Obesity.  Started on baby aspirin at 20 weeks   Prenatal Transfer Tool  Maternal Diabetes: No Genetic Screening: Normal Maternal Ultrasounds/Referrals: Normal Fetal Ultrasounds or other Referrals:  None Maternal Substance Abuse:  No Significant Maternal Medications:  None Significant Maternal Lab Results: Group B Strep negative     OB History    Gravida  1   Para      Term      Preterm      AB      Living        SAB      TAB      Ectopic      Multiple      Live Births             Past Medical History:  Diagnosis Date  . GERD (gastroesophageal reflux disease)   . Headache    ocular migraines like twice a year   . Teratoma    Past Surgical History:  Procedure Laterality Date  . LAPAROTOMY N/A 08/20/2016   Procedure: EXPLORATORY LAPAROTOMY, REMOVAL OF LEFT TUBE AND OVARY,;  Surgeon: Everitt Amber, MD;  Location: [facility];  Service: Gynecology;  Laterality: N/A;  . NO PAST SURGERIES    . OOPHORECTOMY    . SALPINGOOPHORECTOMY N/A 08/20/2016   Procedure: UNILATERAL SALPINGO OOPHORECTOMY;  Surgeon: Everitt Amber, MD;  Location: [facility];  Service: Gynecology;  Laterality: N/A;   Family History: family history includes Cancer in her brother, father,  maternal grandfather, and paternal grandfather; Diabetes in her father, maternal grandfather, and paternal grandfather; Heart disease in her father, maternal grandfather, and maternal grandmother; Hypertension in her father, maternal grandmother, and mother. Social History:  reports that she has never smoked. She has never used smokeless tobacco. No history on file for alcohol and drug.  Review of Systems - Negative except Anxiety, discomfort of pregnancy     Blood pressure 118/83, temperature 98.4 F (36.9 C), temperature source Oral, resp. rate 18, height 5\' 10"  (1.778 m).  Physical Exam:  Gen: well appearing, no distress CV: RRR Pulm: CTAB Back: no CVAT Abd: gravid, NT, no RUQ pain LE: Trace edema, equal bilaterally, non-tender  Real-time ultrasound: Confirms breech presentation  Prenatal labs: ABO, Rh: --/--/O POS, O POS Performed at [facility] [address], [place], [place] [address]  [phone]) Antibody: NEG (02/11 0905) Rubella: Immune (08/11 0000) RPR: NON REACTIVE (02/11 0905)  HBsAg: Negative (08/11 0000)  HIV: Non-reactive (08/11 0000)  GBS: Negative/-- (01/18 0000)  1 hr Glucola 96  Genetic screening normal maternity 21 Anatomy US normal  CBC    Component Value Date/Time   WBC 10.7 (H) 11/30/2019 0905   RBC 4.51 11/30/2019 0905   HGB 12.0 11/30/2019 0905   HCT 38.1 11/30/2019 0905   PLT  309 11/30/2019 0905   MCV 84.5 11/30/2019 0905   MCV 82.6 07/27/2016 1731   MCH 26.6 11/30/2019 0905   MCHC 31.5 11/30/2019 0905   RDW 15.6 (H) 11/30/2019 0905   LYMPHSABS 1.5 08/17/2016 1018   MONOABS 0.6 08/17/2016 1018   EOSABS 0.2 08/17/2016 1018   BASOSABS 0.0 08/17/2016 1018     Assessment/Plan: 34 y.o. G1P0 at [redacted]w[redacted]d Breech presentation.  Plan primary cesarean section Macrosomia Anxiety continue fluoxetine   Ala Dach 12/02/2019, 9:56 AM

## 2019-12-02 NOTE — Op Note (Signed)
12/02/2019  12:04 PM  PATIENT:  Lindsay Turner  34 y.o. female  PRE-OPERATIVE DIAGNOSIS:  breech presentation  POST-OPERATIVE DIAGNOSIS:  breech presentation  PROCEDURE:  Procedure(s): CESAREAN SECTION (N/A)  LTCS, 2 layer cloosure  SURGEON:  Surgeon(s) and Role:    * Aloha Gell, MD - Primary  PHYSICIAN ASSISTANT: none  ASSISTANTS: Haskel Khan, CNM   ANESTHESIA:   spinal  EBL:  326 mL   BLOOD ADMINISTERED:none  DRAINS: Urinary Catheter (Foley)   LOCAL MEDICATIONS USED:  NONE  SPECIMEN:  Source of Specimen:  placenta  DISPOSITION OF SPECIMEN:  LDR for disporal  COUNTS:  YES  TOURNIQUET:  * No tourniquets in log *  DICTATION: .Note written in EPIC  PLAN OF CARE: Admit to inpatient   PATIENT DISPOSITION:  PACU - hemodynamically stable.   Delay start of Pharmacological VTE agent (>24hrs) due to surgical blood loss or risk of bleeding: yes    Findings:  @BABYSEXEBC @ infant,  APGAR (1 MIN): 8   APGAR (5 MINS): 9   APGAR (10 MINS):   Normal uterus, absent L tube and ovary, nl R tube and ovary.  normal placenta. 3VC, clear amniotic fluid  EBL: 326 cc Antibiotics:  3g Ancef Complications: none  Indications: This is a 34 y.o. year-old, G1  At [redacted]w[redacted]d admitted for [facility] for breech and macrosomia. Risks benefits and alternatives of the procedure were discussed with the patient who agreed to proceed  Procedure:  After informed consent was obtained the patient was taken to the operating room where spinal anesthesia was initiated.  She was prepped and draped in the normal sterile fashion in dorsal supine position with a leftward tilt.  A foley catheter was in place.  A Pfannenstiel skin incision was made 2 cm above the pubic symphysis in the midline with the scalpel.  Dissection was carried down with the Bovie cautery until the fascia was reached. The fascia was incised in the midline. The incision was extended laterally with the Mayo scissors. The inferior aspect  of the fascial incision was grasped with the Coker clamps, elevated up and the underlying rectus muscles were dissected off sharply. The superior aspect of the fascial incision was grasped with the Coker clamps elevated up and the underlying rectus muscles were dissected off sharply.  The peritoneum was entered sharlpy. The peritoneal incision was extended superiorly and inferiorly with good visualization of the bladder. The bladder blade was inserted and palpation was done to assess the fetal position and the location of the uterine vessels. The lower segment of the uterus was incised sharply with the scalpel and extended  bluntly in the cephalo-caudal fashion. The infant was grasped, brought to the incision,  rotated and the infant was delivered with fundal pressure. The nose and mouth were bulb suctioned. The cord was clamped and cut after 1 minute delay. The infant was handed off to the waiting pediatrician. The placenta was expressed. The uterus was exteriorized. The uterus was cleared of all clots and debris. The uterine incision was repaired with 0 Vicryl in a running locked fashion.  A second layer of the same suture was used in an imbricating fashion to obtain excellent hemostasis.  The uterus was then returned to the abdomen, the gutters were cleared of all clots and debris. 2 figure of 8s were placed in the midline. The uterine incision was reinspected and found to be hemostatic. The peritoneum was grasped and closed with 2-0 Vicryl in a running fashion. The cut muscle edges  and the underside of the fascia were inspected and found to be hemostatic. The fascia was closed with 0 Vicryl in 2 halves. The subcutaneous tissue was irrigated. Scarpa's layer was closed with a 2-0 plain gut suture. The skin was closed with a 4-0 Monocryl in a single layer. The patient tolerated the procedure well. Sponge lap and needle counts were correct x3 and patient was taken to the recovery room in a stable condition.  Ala Dach 12/02/2019 12:06 PM

## 2019-12-02 NOTE — Anesthesia Procedure Notes (Signed)
Spinal  Patient location during procedure: OR Start time: 12/02/2019 10:41 AM End time: 12/02/2019 10:44 AM Staffing Performed: anesthesiologist  Anesthesiologist: Brennan Bailey, MD Preanesthetic Checklist Completed: patient identified, IV checked, risks and benefits discussed, monitors and equipment checked, pre-op evaluation and timeout performed Spinal Block Patient position: sitting Prep: DuraPrep and site prepped and draped Patient monitoring: heart rate, continuous pulse ox and blood pressure Approach: midline Location: L3-4 Injection technique: single-shot Needle Needle type: Pencan  Needle gauge: 24 G Needle length: 10 cm Assessment Sensory level: T4 Additional Notes Risks, benefits, and alternative discussed. Patient gave consent to procedure. Prepped and draped in sitting position. Clear CSF obtained after one needle pass. Positive terminal aspiration. No pain or paraesthesias with injection. Patient tolerated procedure well. Vital signs stable. Tawny Asal, MD

## 2019-12-03 LAB — CBC
HCT: 30.2 % — ABNORMAL LOW (ref 36.0–46.0)
Hemoglobin: 9.3 g/dL — ABNORMAL LOW (ref 12.0–15.0)
MCH: 26.3 pg (ref 26.0–34.0)
MCHC: 30.8 g/dL (ref 30.0–36.0)
MCV: 85.3 fL (ref 80.0–100.0)
Platelets: 252 10*3/uL (ref 150–400)
RBC: 3.54 MIL/uL — ABNORMAL LOW (ref 3.87–5.11)
RDW: 15.6 % — ABNORMAL HIGH (ref 11.5–15.5)
WBC: 12.9 10*3/uL — ABNORMAL HIGH (ref 4.0–10.5)
nRBC: 0 % (ref 0.0–0.2)

## 2019-12-03 MED ORDER — POLYSACCHARIDE IRON COMPLEX 150 MG PO CAPS
150.0000 mg | ORAL_CAPSULE | Freq: Every day | ORAL | Status: DC
  Administered 2019-12-03 – 2019-12-05 (×3): 150 mg via ORAL
  Filled 2019-12-03 (×3): qty 1

## 2019-12-03 MED ORDER — MAGNESIUM OXIDE 400 (241.3 MG) MG PO TABS
400.0000 mg | ORAL_TABLET | Freq: Two times a day (BID) | ORAL | Status: DC
  Administered 2019-12-03 – 2019-12-05 (×5): 400 mg via ORAL
  Filled 2019-12-03 (×5): qty 1

## 2019-12-03 MED ORDER — IBUPROFEN 600 MG PO TABS
600.0000 mg | ORAL_TABLET | Freq: Four times a day (QID) | ORAL | Status: DC | PRN
  Administered 2019-12-03 – 2019-12-04 (×4): 600 mg via ORAL
  Filled 2019-12-03 (×5): qty 1

## 2019-12-03 MED ORDER — ACETAMINOPHEN 325 MG PO TABS
650.0000 mg | ORAL_TABLET | Freq: Four times a day (QID) | ORAL | Status: DC | PRN
  Administered 2019-12-03 – 2019-12-05 (×5): 650 mg via ORAL
  Filled 2019-12-03 (×5): qty 2

## 2019-12-03 NOTE — Lactation Note (Signed)
This note was copied from a baby's chart. Lactation Consultation Note  Patient Name: Girl Nelina Schuth [id number] Date: 12/03/2019 Reason for consult: Initial assessment;Term;Primapara;1st time breastfeeding  P1 mother whose infant is now 24 hours old.  This is a term baby.  Baby was very fussy when I arrived.  Mother has attempted breast feeding and informed me that the baby went to the nursery last night so the parents could sleep for 3 hours.  She was given formula in the nursery.  Baby appears very hungry at this time.  Attempted to console her with my gloved finger.  She was unconsolable and would not maintain a suck on my finger.  Mother able to only express one colostrum drop which I finger fed back to her.  Burped and attempted to latch to the right breast in the football hold without success.  I was able to latch her but she continued to cry and would not calm down.  Placed her in a tight swaddle and attempted to calm her again with my gloved finger.  This time she settled down.  Handed her to father and offered to initiate the DEBP for mother.  Mother was interested in beginning to pump.  Pump parts, assembly, disassembly and cleaning reviewed.  #24 flange size is appropriate at this time.  Observed mother pumping while reviewing breast feeding basics.  Suggested lots of STS, feeding cues, discussed hand expression, milk coming to volume, how to obtain a good milk supply, finger / spoon feeding and supplementing as needed.  Parents questioned pacifier use; appropriate guidance given and they will refrain from using a pacifier at this time.  Parents are quite calm for first time parents and very receptive to learning; a pleasure to teach.  They seem to work well together.    Discussed using donor breast milk with mother since, due to cues and fussing, baby will require supplementation.  Mother interested and briefly reviewed including signing a consent form.  RN updated and will follow through  with this.    Mother will continue to observe for feeding cues and feed on cue or at least 8-12 times/24 hours.  She will supplement with her EBM first and use donor milk as needed.  Mother will continue to post pump for 15 minutes after every breast feeding and give back any EBM she obtains.  Suggested she call her RN/LC for further questions/concerns.    Mom made aware of O/P services, breastfeeding support groups, community resources, and our phone # for post-discharge questions.  Mother has a DEBP for home use.       Maternal Data Formula Feeding for Exclusion: No Has patient been taught Hand Expression?: Yes Does the patient have breastfeeding experience prior to this delivery?: No  Feeding Feeding Type: Breast Fed  LATCH Score Latch: Too sleepy or reluctant, no latch achieved, no sucking elicited.  Audible Swallowing: None  Type of Nipple: Everted at rest and after stimulation  Comfort (Breast/Nipple): Soft / non-tender  Hold (Positioning): Assistance needed to correctly position infant at breast and maintain latch.  LATCH Score: 5  Interventions Interventions: Breast feeding basics reviewed;Assisted with latch;Skin to skin;Breast massage;Hand express;Breast compression;Adjust position;DEBP;Position options;Support pillows  Lactation Tools Discussed/Used Pump Review: Setup, frequency, and cleaning;Milk Storage Initiated by:: Laneta Guerin Date initiated:: 12/03/19   Consult Status Consult Status: Follow-up Date: 12/04/19 Follow-up type: In-patient    Marteze Vecchio R Suhayla Chisom 12/03/2019, 12:07 PM

## 2019-12-03 NOTE — Progress Notes (Signed)
POD# 1  S: Pt notes pain controlled w/ po meds, minimal lochia, nl void, out of bed w/o dizziness or chest pain, tol reg po, + flatus. Pt is  breastfeeding  Vitals:   12/02/19 1700 12/02/19 2316 12/03/19 0514 12/03/19 1008  BP: 114/64 107/69 120/75 119/62  Pulse: 69 68 63 (!) 54  Resp: 20 16 15 20   Temp: 98.1 F (36.7 C) 98.2 F (36.8 C) 98.2 F (36.8 C) 98 F (36.7 C)  TempSrc:  Oral Oral Oral  SpO2: 97% 100% 100% 100%  Height:        Gen: well appearing CV: RRR Pulm: CTAB Abd: soft, ND, approp tender, fundus below umbilicus, NT Inc: C/D/I,  LE: tr edema, NT  CBC    Component Value Date/Time   WBC 12.9 (H) 12/03/2019 0455   RBC 3.54 (L) 12/03/2019 0455   HGB 9.3 (L) 12/03/2019 0455   HCT 30.2 (L) 12/03/2019 0455   PLT 252 12/03/2019 0455   MCV 85.3 12/03/2019 0455   MCV 82.6 07/27/2016 1731   MCH 26.3 12/03/2019 0455   MCHC 30.8 12/03/2019 0455   RDW 15.6 (H) 12/03/2019 0455   LYMPHSABS 1.5 08/17/2016 1018   MONOABS 0.6 08/17/2016 1018   EOSABS 0.2 08/17/2016 1018   BASOSABS 0.0 08/17/2016 1018    A/P: POD#  1 s/p primary cesarean section for breech and macrosomia - post-op. Doing well.  Continue routine care -Anemia.  Will start iron and magnesium  Lindsay Turner 12/03/2019 12:02 PM

## 2019-12-04 ENCOUNTER — Encounter (HOSPITAL_COMMUNITY): Payer: Self-pay | Admitting: Obstetrics

## 2019-12-04 DIAGNOSIS — Z98891 History of uterine scar from previous surgery: Secondary | ICD-10-CM

## 2019-12-04 MED ORDER — IBUPROFEN 600 MG PO TABS
600.0000 mg | ORAL_TABLET | Freq: Four times a day (QID) | ORAL | Status: DC
  Administered 2019-12-04 – 2019-12-05 (×4): 600 mg via ORAL
  Filled 2019-12-04 (×4): qty 1

## 2019-12-04 MED ORDER — OXYCODONE HCL 5 MG PO TABS
5.0000 mg | ORAL_TABLET | ORAL | Status: DC | PRN
  Administered 2019-12-04 – 2019-12-05 (×3): 5 mg via ORAL
  Filled 2019-12-04 (×3): qty 1

## 2019-12-04 NOTE — Progress Notes (Signed)
POSTOPERATIVE DAY # 2 S/P Primary LTCS for breech presentation and macrosomia, baby girl "Lucy"   S:         Reports feeling more sore today; asking for stronger pain medication; Motrin and Tylenol only ordered PRN             Tolerating po intake / no nausea / no vomiting / + flatus / + BM x1  Denies dizziness, SOB, or CP             Bleeding is getting lighter             Up ad lib / ambulatory/ voiding QS without difficulty  Newborn breast feeding with formula and donor milk supplementing; mom pumping colostrum    O:  VS: BP 122/80 (BP Location: Right Arm)   Pulse (!) 54   Temp 98.1 F (36.7 C) (Oral)   Resp 17   Ht 5\' 10"  (1.778 m)   SpO2 100%   Breastfeeding Unknown   BMI 39.31 kg/m    LABS:               Recent Labs    12/03/19 0455  WBC 12.9*  HGB 9.3*  PLT 252               Bloodtype: --/--/O POS, O POS Performed at [facility], [place] [address], [place], [place] [address]  [phone] [id number])  Rubella: Immune (08/11 0000)                                             I&O: Intake/Output      02/14 0701 - 02/15 0700 02/15 0701 - 02/16 0700   I.V.     Total Intake     Urine     Blood     Total Output     Net                       Physical Exam:             Alert and Oriented X3  Lungs: Clear and unlabored  Heart: regular rate and rhythm / no murmurs  Abdomen: soft, non-tender, non-distended, obese, scarred             Fundus: firm, non-tender, U-2             Dressing: honeycomb with steri-strips c/d/i              Incision:  approximated with sutures / no erythema / no ecchymosis / no drainage  Perineum: intact  Lochia: small rubra on pad  Extremities:  No edema, no calf pain or tenderness,  A/P:     POD # 2 S/P Primary LTCS for breech and macrosomia             ABL Anemia    - stable on oral FE  Hx. Of Anxiety   - on Fluoxetine 10mg  daily             Routine postoperative care   Continue working with lactation  Abdominal binder  Oxycodone IR  5-10mg  every 4hrs PRN for severe pain  Change Motrin 600mg  every 6hrs scheduled              Plan for d/c home tomorrow   Lars Pinks, MSN, [facility]

## 2019-12-05 ENCOUNTER — Inpatient Hospital Stay (HOSPITAL_COMMUNITY): Admit: 2019-12-05 | Payer: Federal, State, Local not specified - PPO

## 2019-12-05 DIAGNOSIS — O9902 Anemia complicating childbirth: Secondary | ICD-10-CM | POA: Diagnosis not present

## 2019-12-05 DIAGNOSIS — F419 Anxiety disorder, unspecified: Secondary | ICD-10-CM | POA: Diagnosis present

## 2019-12-05 MED ORDER — MAGNESIUM OXIDE 400 (241.3 MG) MG PO TABS: 400.0000 mg | ORAL_TABLET | Freq: Two times a day (BID) | ORAL | Status: AC

## 2019-12-05 MED ORDER — OXYCODONE HCL 5 MG PO TABS: 5.0000 mg | ORAL_TABLET | ORAL | 0 refills | Status: AC | PRN

## 2019-12-05 MED ORDER — ACETAMINOPHEN 500 MG PO TABS: 1000.0000 mg | ORAL_TABLET | Freq: Four times a day (QID) | ORAL | 2 refills | Status: AC | PRN

## 2019-12-05 MED ORDER — COCONUT OIL OIL: 1.0000 "application " | TOPICAL_OIL | 0 refills | Status: AC | PRN

## 2019-12-05 MED ORDER — SIMETHICONE 80 MG PO CHEW: 80.0000 mg | CHEWABLE_TABLET | ORAL | 0 refills | Status: AC | PRN

## 2019-12-05 MED ORDER — IBUPROFEN 600 MG PO TABS: 600.0000 mg | ORAL_TABLET | Freq: Four times a day (QID) | ORAL | 0 refills | Status: AC

## 2019-12-05 MED ORDER — POLYSACCHARIDE IRON COMPLEX 150 MG PO CAPS: 150.0000 mg | ORAL_CAPSULE | Freq: Every day | ORAL | Status: AC

## 2019-12-05 NOTE — Lactation Note (Signed)
This note was copied from a baby's chart. Lactation Consultation Note  Patient Name: Lindsay Turner [id number] Date: 12/05/2019 Reason for consult: Follow-up assessment;Primapara;1st time breastfeeding;Term;Infant weight loss;Other (Comment)(5% weight loss) Baby is 30 hours old  Per mom the baby recently fed 60 ml and had a wet and stool. Per mom has pumped 2-3 times in the last 24 hours .  LC reviewed supply and demand/ stressed the importance of consistent  Pumping around the clock to establish and protect the milk supply.  Per mom has a DEBP at home - Ameda.  Sore nipple and engorgement prevention and tx reviewed.  LC showed mom in her Infant and mother booklet the engorgement tx and storage of breast milk guidelines.  Mom shared she may re-latch when her milk comes in .  [facility] explored and recommended the ways to do that and stressed the importance of taking the baby to the breast calmly, may need to give the baby a portion of a feeding prior to latching. Once milk volume increases just feed the baby at the breast.  Due to mom having to pump and latch. LC recommended working on latching as recommended above, supplement at lest 30 ml and post pump. Next feeding switch to the other breast.  2nd option if only desiring to pump - should be 8-10 times in 24 hours both breast for 15 -20 mins consistently.  S/S's of mastitis reviewed . LC reassured mom if she has questions or concerns about breast feeding she could call .  LC provided the [facility] pamphlet with phone numbers provided.   Maternal Data Has patient been taught Hand Expression?: Yes  Feeding Feeding Type: (recently fed a bottle) Nipple Type: Slow - flow  LATCH Score                   Interventions Interventions: Breast feeding basics reviewed  Lactation Tools Discussed/Used Tools: Pump Breast pump type: Double-Electric Breast Pump Pump Review: Milk Storage   Consult Status Consult Status: Complete Date:  12/05/19    Myer Haff 12/05/2019, 9:51 AM

## 2019-12-05 NOTE — Discharge Summary (Signed)
OB Discharge Summary  Patient Name: Lindsay Turner DOB: [birth year] MRN: [id number]  Date of admission: 12/02/2019 Delivering provider: Aloha Gell   Date of discharge: 12/05/2019  Admitting diagnosis: Breech presentation [O32.1XX0] Intrauterine pregnancy: [redacted]w[redacted]d     Secondary diagnosis:Principal Problem:   Postpartum care following cesarean delivery (2/13) Active Problems:   Breech presentation   S/P cesarean section   Maternal anemia, with delivery   Anxiety disorder  Additional problems:none     Discharge diagnosis:  Patient Active Problem List   Diagnosis Date Noted  . Maternal anemia, with delivery 12/05/2019  . Anxiety disorder 12/05/2019  . S/P cesarean section 12/04/2019  . Postpartum care following cesarean delivery (2/13) 12/04/2019  . Breech presentation 12/02/2019                                                                Post partum procedures:none  Pain control: Spinal  Complications: None   Hospital course:  Sceduled C/S   34 y.o. yo G1P1001 at [redacted]w[redacted]d was admitted to the hospital 12/02/2019 for scheduled cesarean section with the following indication:Malpresentation.  Membrane Rupture Time/Date: 11:14 AM ,12/02/2019   Patient delivered a Viable infant.12/02/2019  Details of operation can be found in separate operative note.  Pateint had an uncomplicated postpartum course.  She is ambulating, tolerating a regular diet, passing flatus, and urinating well. Patient is discharged home in stable condition on  12/05/19         Physical exam  Vitals:   12/03/19 2100 12/04/19 0622 12/04/19 1301 12/04/19 2118  BP: 128/69 122/80 117/66 122/76  Pulse: 68 (!) 54 66 61  Resp: 18 17 18 15   Temp: 98.2 F (36.8 C) 98.1 F (36.7 C) 98.1 F (36.7 C) 97.9 F (36.6 C)  TempSrc: Oral Oral Oral Oral  SpO2:  100%  100%  Height:       General: alert, cooperative and no distress Lochia: appropriate Uterine Fundus: firm Incision: No significant erythema,  Dressing is clean, dry, and intact DVT Evaluation: No cords or calf tenderness. No significant calf/ankle edema. Labs: Lab Results  Component Value Date   WBC 12.9 (H) 12/03/2019   HGB 9.3 (L) 12/03/2019   HCT 30.2 (L) 12/03/2019   MCV 85.3 12/03/2019   PLT 252 12/03/2019   CMP Latest Ref Rng & Units 08/22/2016  Glucose 65 - 99 mg/dL 92  BUN 6 - 20 mg/dL 10  Creatinine 0.44 - 1.00 mg/dL 0.63  Sodium 135 - 145 mmol/L 138  Potassium 3.5 - 5.1 mmol/L 3.8  Chloride 101 - 111 mmol/L 106  CO2 22 - 32 mmol/L 25  Calcium 8.9 - 10.3 mg/dL 8.7(L)  Total Protein 6.5 - 8.1 g/dL -  Total Bilirubin 0.3 - 1.2 mg/dL -  Alkaline Phos 38 - 126 U/L -  AST 15 - 41 U/L -  ALT 14 - 54 U/L -    Discharge instruction: per After Visit Summary and "Baby and Me Booklet".  After Visit Meds:  Allergies as of 12/05/2019   No Known Allergies     Medication List    STOP taking these medications   aspirin EC 81 MG tablet     TAKE these medications   acetaminophen 500 MG tablet Commonly known as: TYLENOL Take 2 tablets (1,000  mg total) by mouth every 6 (six) hours as needed.   coconut oil Oil Apply 1 application topically as needed.   [employer] Fiber Gummies 2 g Chew Chew 2 each by mouth daily.   docusate sodium 100 MG capsule Commonly known as: COLACE Take 100 mg by mouth daily.   FLUoxetine 10 MG capsule Commonly known as: PROZAC Take 10 mg by mouth daily.   ibuprofen 600 MG tablet Commonly known as: ADVIL Take 1 tablet (600 mg total) by mouth every 6 (six) hours.   iron polysaccharides 150 MG capsule Commonly known as: Ferrex 150 Take 1 capsule (150 mg total) by mouth daily.   magnesium oxide 400 (241.3 Mg) MG tablet Commonly known as: MAG-OX Take 1 tablet (400 mg total) by mouth 2 (two) times daily.   oxyCODONE 5 MG immediate release tablet Commonly known as: Oxy IR/ROXICODONE Take 1-2 tablets (5-10 mg total) by mouth every 4 (four) hours as needed for severe pain.   prenatal  multivitamin Tabs tablet Take 1 tablet by mouth daily at 12 noon.   simethicone 80 MG chewable tablet Commonly known as: MYLICON Chew 1 tablet (80 mg total) by mouth as needed for flatulence.       Diet: routine diet  Activity: Advance as tolerated. Pelvic rest for 6 weeks.   Postpartum contraception: to be addressed at [facility] visit  Newborn Data: Live born female  Birth Weight: 9 lb 2.7 oz (4160 g) APGAR: 74, 9  Newborn Delivery   Birth date/time: 12/02/2018 11:15:00 Delivery type: C-Section, Low Transverse C-section categorization: Primary      named Lucy Baby Feeding: Bottle and Breast Disposition:home with mother   Delivery Report:  Review the Delivery Report for details.    Follow up: Follow-up Information    Aloha Gell, MD. Schedule an appointment as soon as possible for a visit in 6 week(s).   Specialty: Obstetrics and Gynecology Contact information: [place] [address] [phone]             Signed: Otilio Carpen, MSN 12/05/2019, 10:37 AM

## 2019-12-20 ENCOUNTER — Ambulatory Visit: Payer: Self-pay

## 2019-12-20 NOTE — Lactation Note (Signed)
This note was copied from a baby's chart. Lactation Consultation Note  Patient Name: Lindsay Turner [id number] Date: 12/20/2019     12/20/2019  Name: Lindsay Turner MRN: [id number] Date of Birth: 12/02/2018 Gestational Age: Gestational Age: [redacted]w[redacted]d Birth Weight: 146.7 oz Weight today:  Weight: 9 lb 8.9 oz (2867 g)  32 week old infant presents today with mom and dad for feeding assessment.   Infant has gained 364 grams in the last 15 days with an average daily weight gain of 24 grams a day.   Mom reports infant latched well in the beginning. Milk took time to come in. They fed her Donor milk and formula. She is not latching well. Milk supply is up and down but not adequate.   Mom is pumping during the day and not much at night, she is going up to 7 hours at night with no pumping. Reviewed supply and demand and enc mom to pump about 7 times a day to protect milk supply.   Infant latched well to the breast in the office. Enc mom to continue to offer the breast as much as she can to help increase her supply and help infant learn to breast feed better. Reviewed infant may need a bottle before or after BF as she most likely still need supplement based on mom's supply.   Infant is more alert at night at sleeps more during the day, discussed this is normal and that in time infant should change and be more alert during the day.   Reviewed Galactagogues with mom. Mom is drinking Mother's Milk Tea.   Infant to follow up with Dr. Jerrye Beavers tomorrow. Infant to follow up with Lactation in 2 weeks. Mom informed of BF support groups.   Dad present and very involved in infant care. Dad has returned to work and mom is planning to go back to work in May.    General Information: Mother's reason for visit: Feeding assessment, not latching much Consult: Initial Lactation consultant: Nonah Mattes RN,IBCLC Breastfeeding experience: bottle feeding with minimal latching   Maternal medications: Pre-natal  vitamin, Iron, Other, Stool softener, Tylenol (acetaminophen)(Simethacone, Fluoxetine 10 mg QD, Mg)  Breastfeeding History: Frequency of breast feeding: not latching    Supplementation: Supplement method: bottle(Nuk Natural) Brand: Similac Formula volume: 3-4 ounces Formula frequency: 10-11 x a day   Breast milk volume: 3-4 ounces Breast milk frequency: 1 x a day   Pump type: Ameda(Mya Joy) Pump frequency: every 3 hours during the day, not much at night Pump volume: 3-4 ounces in a day  Infant Output Assessment: Voids per 24 hours: 8+ Urine color: Clear yellow Stools per 24 hours: 1-2 Stool color: Yellow  Breast Assessment: Breast: Soft, Compressible Nipple: Erect Pain level: 0 Pain interventions: Bra, Breast pump  Feeding Assessment: Infant oral assessment: WNL   Positioning: Football(left breast, 15 minutes) Latch: 2 - Grasps breast easily, tongue down, lips flanged, rhythmical sucking. Audible swallowing: 2 - Spontaneous and intermittent Type of nipple: 2 - Everted at rest and after stimulation Comfort: 2 - Soft/non-tender Hold: 1 - Assistance needed to correctly position infant at breast and maintain latch LATCH score: 9 Latch assessment: Deep Lips flanged: Yes Suck assessment: Displays both   Pre-feed weight: 4334 grams Post feed weight: 4372 grams Amount transferred: 38 ml Amount supplemented: 0  Additional Feeding Assessment: Infant oral assessment: WNL   Positioning: Football(right breast, 10 minutes) Latch: 2 - Grasps breast easily, tongue down, lips flanged, rhythmical sucking. Audible swallowing: 1 -  A few with stimulation Type of nipple: 2 - Everted at rest and after stimulation Comfort: 2 - Soft/non-tender Hold: 1 - Assistance needed to correctly position infant at breast and maintain latch LATCH score: 8 Latch assessment: Deep Lips flanged: Yes Suck assessment: Displays both   Pre-feed weight: 4372 grams Post feed weight: 4372  grams Amount transferred: 0 Amount supplemented: 60 ml formula via bottle + 30 ml EBM via bottle  Totals: Total amount transferred: 38 ml Total supplement given: 60 ml formula via bottle + 30 ml EBM via bottle Total amount pumped post feed: did not pump   Plan:  1. Offer infant the breast with feeding cues as mom and infant want 2. Keep infant awake at the breast as needed 3. Feed infant skin to skin 4. Massage/compress breast with feeding as needed to keep infant awake at the breast 5. Offer both breasts with each feeding 6. Empty the first breast before offering the second breast 7. Offer infant 1-2 ounces in a bottle if she is frantic at the breast when trying to latch 8. Offer infant a bottle of pumped breast milk or formula after breast feeding if she is still cueing to feed, feed her until she is satisfied 9. When offering the bottle use the paced bottle feeding method (video on [email or web address].[email or web address]) 10. Infant needs about 81-108 ml (3-3.5 ounces) for 8 feedings a day or 645-860 ml (22-29 ounces) in 24 hours. Infant may take more or less depending on how often she feeds. Feed infant until she is satisfied.  11. To protect your milk supply, would recommend that you pump 7-8 times a day with your double electric breast pump. Pump for about 20 minutes or until breasts are empty.  12. Some women find the following supplements to be helpful for milk production (please discuss with OB prior to taking supplements): Fenugreek 1200-1800 mg 3 times a day ( not recommended if allergic to Legumes, have Thyroid issues or blood sugar issues). Stop taking if noting a decrease in you milk supply with taking [place] Mother Love More Milk Special Blend (contains Fenugreek and other herbs) Legendairy Milk has several herbal blends that are Fenugreek free Reglan 10 mg 3 times a day (obtained from OB) has side effects of depression and Tardive Diskenesia and some OB's will not prescribe. 13. Keep up the good  work 81. Thank you for allowing me to assist you today 15. Please call with any questions or concerns as needed [phone] 16. Follow up with Lactation in 2 weeks  [name], IBCLC                                                    Debby Freiberg Caymen Dubray 12/20/2019, 8:20 AM

## 2024-08-05 ENCOUNTER — Other Ambulatory Visit (HOSPITAL_BASED_OUTPATIENT_CLINIC_OR_DEPARTMENT_OTHER): Payer: Self-pay

## 2024-08-05 MED ORDER — FLUZONE 0.5 ML IM SUSY
0.5000 mL | PREFILLED_SYRINGE | Freq: Once | INTRAMUSCULAR | 0 refills | Status: AC
  Filled 2024-08-05: qty 0.5, 1d supply, fill #0
